# Patient Record
Sex: Female | Born: 1980 | Race: Black or African American | Hispanic: No | Marital: Single | Smoking: Current every day smoker
Health system: Southern US, Community
[De-identification: ages and names within clinical notes are randomized; demographics above are authoritative.]

## PROBLEM LIST (undated history)

## (undated) ENCOUNTER — Inpatient Hospital Stay: Payer: Self-pay

## (undated) DIAGNOSIS — O24419 Gestational diabetes mellitus in pregnancy, unspecified control: Secondary | ICD-10-CM

## (undated) DIAGNOSIS — D649 Anemia, unspecified: Secondary | ICD-10-CM

## (undated) DIAGNOSIS — G5600 Carpal tunnel syndrome, unspecified upper limb: Secondary | ICD-10-CM

## (undated) DIAGNOSIS — M722 Plantar fascial fibromatosis: Secondary | ICD-10-CM

## (undated) DIAGNOSIS — R011 Cardiac murmur, unspecified: Secondary | ICD-10-CM

## (undated) DIAGNOSIS — A749 Chlamydial infection, unspecified: Secondary | ICD-10-CM

## (undated) DIAGNOSIS — T4145XA Adverse effect of unspecified anesthetic, initial encounter: Secondary | ICD-10-CM

## (undated) DIAGNOSIS — K439 Ventral hernia without obstruction or gangrene: Secondary | ICD-10-CM

## (undated) HISTORY — DX: Ventral hernia without obstruction or gangrene: K43.9

## (undated) HISTORY — DX: Carpal tunnel syndrome, unspecified upper limb: G56.00

## (undated) HISTORY — DX: Plantar fascial fibromatosis: M72.2

## (undated) HISTORY — DX: Chlamydial infection, unspecified: A74.9

---
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
## 2004-01-04 ENCOUNTER — Ambulatory Visit (HOSPITAL_COMMUNITY): Admission: RE | Admit: 2004-01-04 | Discharge: 2004-01-04 | Payer: Self-pay | Admitting: Gynecology

## 2004-05-28 ENCOUNTER — Inpatient Hospital Stay (HOSPITAL_COMMUNITY): Admission: AD | Admit: 2004-05-28 | Discharge: 2004-05-31 | Payer: Self-pay | Admitting: Gynecology

## 2004-08-16 ENCOUNTER — Encounter (INDEPENDENT_AMBULATORY_CARE_PROVIDER_SITE_OTHER): Payer: Self-pay | Admitting: *Deleted

## 2004-08-16 ENCOUNTER — Ambulatory Visit (HOSPITAL_COMMUNITY): Admission: RE | Admit: 2004-08-16 | Discharge: 2004-08-16 | Payer: Self-pay | Admitting: Gynecology

## 2005-01-19 ENCOUNTER — Emergency Department: Payer: Self-pay | Admitting: Unknown Physician Specialty

## 2005-05-01 ENCOUNTER — Emergency Department: Payer: Self-pay | Admitting: Emergency Medicine

## 2006-03-31 ENCOUNTER — Emergency Department: Payer: Self-pay | Admitting: Emergency Medicine

## 2006-04-08 ENCOUNTER — Ambulatory Visit: Payer: Self-pay | Admitting: Gynecology

## 2010-06-14 NOTE — H&P (Signed)
Doris West, Doris West NO.:  [id number]   MEDICAL RECORD NO.:  [id number]          PATIENT TYPE:  AMB   LOCATION:  SDC                           FACILITY:  [facility]   PHYSICIAN:  Ginger Carne, MD  DATE OF BIRTH:  Sep 27, 1980   DATE OF ADMISSION:  DATE OF DISCHARGE:                                HISTORY & PHYSICAL   REASON FOR HOSPITALIZATION:  Left dermoid cyst.   HISTORY OF PRESENT ILLNESS:  This is a 30 year old, gravida 2, para 1-0-1-1,  African-American female admitted because of a 4 cm left dermoid cyst.  The  patient recently had a normal vaginal delivery in May of 2006.  During her  antenatal ultrasound at 20 weeks, she was noted to have a 3.5 cm left  ovarian dermoid cyst.  Follow up ultrasound obtained on July 18, 2004  demonstrated a 3.8 cm left dermoid cyst.  The patient has complained of left  lower quadrant pain, but is otherwise asymptomatic.   OBSTETRIC AND GYNECOLOGIC HISTORY:  The patient had a normal vaginal  delivery in May of 2006.  She has had a voluntary termination of pregnancy  in 1999.  Current method of contraception Depo-Provera.   ALLERGIES:  PENICILLIN.   MEDICATION HISTORY:  None.   SURGICAL HISTORY:  Noncontributory.   MEDICAL HISTORY:  Noncontributory.   FAMILY HISTORY:  Negative for first degree relatives with breast, colon,  ovarian, or uterine carcinoma.   SOCIAL HISTORY:  The patient smokes a half pack of cigarettes daily, but  denies illicit drug abuse or alcohol abuse.   REVIEW OF SYSTEMS:  Noncontributory.   PHYSICAL EXAMINATION:  VITAL SIGNS:  Weight 166 pounds, blood pressure  110/72, height 5 feet 7 inches.  HEENT:  Grossly normal.  BREASTS:  Without mass or discharge, thickenings, or tenderness.  CHEST:  Clear to percussion and auscultation.  CARDIOVASCULAR:  Without murmurs or enlargement.  Regular rate and rhythm.  EXTREMITIES/LYMPHATICS/SKIN/NEUROLOGICAL/MUSCULOSKELETAL SYSTEMS:  Within  normal limits.  ABDOMEN:  Soft without gross hepatosplenomegaly.  PELVIC:  External genitalia, vulva, and vagina normal.  Cervix smooth  without erosions or lesions.  Uterus is normal in size.  Left adnexa reveals  a palpable mass measuring approximately 4 cm.  Right adnexa normal.  RECTAL:  Hemoccult negative without masses.   IMPRESSION:  Left ovarian dermoid cyst.   PLAN:  The patient will undergo a left oophorectomy and/or left salpingo-  oophorectomy, and possible laparotomy.  The nature of said procedure  discussed in detail.  Risks including injuries to ureter, bowel, and  bladder, possible conversion to an open procedure, hemorrhage possibly  requiring a blood transfusion, postoperative infection, and other unforeseen  complications were discussed and understood by said patient.       SHB/MEDQ  D:  08/05/2004  T:  08/06/2004  Job:  [id number]

## 2010-06-14 NOTE — Op Note (Signed)
NAMETSUYAKO, JOLLEY NO.:  [id number]   MEDICAL RECORD NO.:  [id number]          PATIENT TYPE:  AMB   LOCATION:  SDC                           FACILITY:  [facility]   PHYSICIAN:  Ginger Carne, MD  DATE OF BIRTH:  03-Jan-1981   DATE OF PROCEDURE:  08/16/2004  DATE OF DISCHARGE:                                 OPERATIVE REPORT   PREOPERATIVE DIAGNOSIS:  Left ovarian dermoid cyst.   POSTOPERATIVE DIAGNOSES:  1.  Left ovarian dermoid cyst.  2.  Stage I endometriosis.   PROCEDURE:  Laparoscopic left oophorectomy.   SURGEON:  Ginger Carne, MD   ASSISTANT:  None.   COMPLICATIONS:  None immediate.   ESTIMATED BLOOD LOSS:  Minimal.   SPECIMEN:  Left ovary.   ANESTHESIA:  General.   OPERATIVE FINDINGS:  Laparoscopic evaluation revealed a 4 cm left ovary  dominated by a dermoid cyst noted preoperatively on successive  ultrasonography.  There was virtually no tissue remaining from said ovary to  be considered normal if the dermoid proper was removed, and therefore the  entire ovary had to be removed.  The left tube and the right tube and ovary  appeared normal.  The patient has stage II endometriosis as well.  There was  stage I endometriosis present with red punctate lesions in the broad  ligaments bilaterally, in the cul-de-sac and uterosacral ligaments.  The  upper abdomen appeared normal, uterine contour normal.  No other findings  observed.   OPERATIVE PROCEDURE:  The patient prepped and in the draped usual fashion  and placed in the the lithotomy position.  Betadine solution used for antiseptic and the patient was catheterized prior  to the procedure.  After adequate general anesthesia, a tenaculum was placed  on the anterior lip of the cervix and a Pelosi uterine manipulator placed in  the endocervical canal.  Afterwards a vertical infraumbilical incision was  made and the Veress needle placed in the abdomen.  Opening and closing  pressures were  10-15 mmHg.  A medial release trocar placed through the same  incision and laparoscope placed in the trocar sleeve.  Two 5 mm ports were  made under direct visualization in the left lower quadrant and left  hypogastric regions.  After identifying the ureter on the left side, the  left mesovarian ligament was bipolar cauterized and cut.  This included the  left utero-ovarian ligament.  Bleeding points were hemostatically checked to  assure there was no active bleeding, and the left tube was left intact.  Afterwards the said  ovary was removed by an Endopouch.  Gas released, trocars removed.  Closure  of the 10 mm fascia site with 0 Vicryl suture and a 4-0 Vicryl for  subcuticular closure.  The instrument and sponge count were correct.  The  patient tolerated procedure well and returned to the postanesthesia recovery  room in excellent condition.       SHB/MEDQ  D:  08/16/2004  T:  08/16/2004  Job:  [id number]

## 2014-01-27 HISTORY — PX: LAPAROSCOPIC OOPHERECTOMY: SHX6507

## 2014-01-27 NOTE — L&D Delivery Note (Signed)
Obstetrical Delivery Note   Date of Delivery:   09/15/2014 Primary OB:   [facility] Gestational Age/EDD: [redacted]w[redacted]d  Antepartum complications: gestational diabetes not well controlled  Delivered By:   Farrel Conners, CNM  Delivery Type:   spontaneous vaginal delivery  Procedure Details:   Just prior to delivery patient developed fever to 101.3 and diagnosed with chorioamnionitis. She received Tylenol 1000 mgm. With mother on her left side, baby rotated during pushing from OP to LOT and delivered spontaneously a viable, but floppy baby with spontaneous respirations over an intact placenta. Baby placed on mother and cord clamped after a minute or two delay and cut. Baby placed in warmer and with tactile stimulation began crying and tone improved. Apgars 7&9. Placenta delivered intact spontaneously and was followed by uterine atony which responded to bimanual, Cytotec 800 mcg PR, and Methergine 0.2 mgm IM.  Anesthesia:    epidural Intrapartum complications: hyperglycemia (on insulin drip), chorioamnionitis, shoulder dystocia GBS:    negative Laceration:    none Episiotomy:    none Placenta:    Via active 3rd stage. To pathology: no Estimated Blood Loss:  * No surgery found *  Baby:    Liveborn female, Apgars 7/9, weight 8#6oz    Avary Eichenberger, CNM

## 2014-02-01 ENCOUNTER — Ambulatory Visit: Payer: Self-pay | Admitting: Family Medicine

## 2014-02-07 ENCOUNTER — Ambulatory Visit: Payer: Self-pay | Admitting: Obstetrics and Gynecology

## 2014-02-07 LAB — HCG, QUANTITATIVE, PREGNANCY: Beta Hcg, Quant.: 66831 m[IU]/mL — ABNORMAL HIGH

## 2014-02-09 ENCOUNTER — Ambulatory Visit: Payer: Self-pay | Admitting: Obstetrics and Gynecology

## 2014-02-09 LAB — HCG, QUANTITATIVE, PREGNANCY: Beta Hcg, Quant.: 81521 m[IU]/mL — ABNORMAL HIGH

## 2014-02-15 ENCOUNTER — Ambulatory Visit: Payer: Self-pay | Admitting: Obstetrics and Gynecology

## 2014-03-13 ENCOUNTER — Emergency Department: Payer: Self-pay | Admitting: Emergency Medicine

## 2014-05-08 ENCOUNTER — Ambulatory Visit
Admit: 2014-05-08 | Disposition: A | Discharge status: Home / Self Care | Payer: Self-pay | Attending: Family Medicine | Admitting: Family Medicine

## 2014-05-15 ENCOUNTER — Emergency Department
Admit: 2014-05-15 | Disposition: A | Discharge status: Home / Self Care | Payer: Self-pay | Admitting: Emergency Medicine

## 2014-07-25 ENCOUNTER — Encounter: Payer: Self-pay | Admitting: *Deleted

## 2014-07-25 ENCOUNTER — Encounter: Payer: Medicaid Other | Attending: Certified Nurse Midwife | Admitting: *Deleted

## 2014-07-25 VITALS — BP 108/62 | Ht 67.5 in | Wt 206.7 lb

## 2014-07-25 DIAGNOSIS — O24419 Gestational diabetes mellitus in pregnancy, unspecified control: Secondary | ICD-10-CM | POA: Insufficient documentation

## 2014-07-25 DIAGNOSIS — O24410 Gestational diabetes mellitus in pregnancy, diet controlled: Secondary | ICD-10-CM

## 2014-07-25 NOTE — Progress Notes (Signed)
**Note De-Doris via Obfuscation** Diabetes Self-Management Education  Visit Type: First/Initial  Appt. Start Time: 1320 Appt. End Time: 1520  07/25/2014  Doris West, Doris West, Doris a 34 y.o. female with a diagnosis of Diabetes: Gestational Diabetes.    ASSESSMENT Blood pressure 108/62, height 5' 7.5" (1.715 m), weight 206 lb 11.2 oz (93.759 kg), last menstrual period 12/25/2013. Body mass index Doris 31.88 kg/(m^2).  Initial Visit Information: Are you currently following a meal plan?: No Are you taking your medications as prescribed?: No (Not taking pre-natal vitamin as prescribed) Are you checking your feet?: No How often do you need to have someone help you when you read instructions, pamphlets, or other written materials from your doctor or pharmacy?: 1 - Never What Doris the last grade level you completed in school?: some college  Psychosocial: Patient Belief/Attitude about Diabetes: Denial ("not sure") Self-care barriers: None Self-management support: Friends, Doctor's office Patient Concerns: Nutrition/Meal planning, Monitoring, Glycemic Control, Problem Solving Special Needs: None Preferred Learning Style: Hands on, Visual Learning Readiness: Contemplating  Complications:  How often do you check your blood sugar?: 0 times/day (not testing) (Pt has a meter but hasn't started testing. BG today was 149 mg/dL at 6:213:10 pm - 2 hrs pp. She had 2 servings of ice cream before class.) Have you had a dilated eye exam in the past 12 months?: No (? Maybe 5 years ago) Have you had a dental exam in the past 12 months?: Yes  Diet Intake: Breakfast: eats at 5-6 am or 10 am Snack (morning): eats multiple servings of desserts/sweets Lunch: eats at 1-2 pm Dinner: eats at 7-8 pm Beverage(s): drinks fruit juices and sugar sweetened beverages  Exercise: Exercise: Light (walking) Light Exercise amount of time (min / week): 50  Individualized Plan for Diabetes Self-Management Training:  Learning  Objective:  Patient will have a greater understanding of diabetes self-management.  Education Topics Reviewed with Patient Today: Definition of diabetes, type 1 and 2, and the diagnosis of gestational diabetes, Factors that contribute to the development of diabetes Role of diet in the treatment of diabetes and the relationship between the three main macronutrients and blood glucose level Helped patient identify appropriate exercises in relation to his/her diabetes, diabetes complications and other health issue. Taught/evaluated SMBG meter., Purpose and frequency of SMBG., Taught/discussed recording of test results and interpretation of SMBG. Relationship between chronic complications and blood glucose control Doris and addressed patients feelings and concerns about diabetes Pregnancy and GDM  Role of pre-pregnancy blood glucose control on the development of the fetus, Reviewed with patient blood glucose goals with pregnancy  PATIENTS GOALS/Plan (Developed by the patient): Improve blood sugars Prevent diabetes complications  Plan:  Read booklet on Gestational Diabetes Follow Gestational Meal Planning Guidelines Complete a 3 Day Food Record and bring to next appointment Check blood sugars 4 x day - before breakfast and 2 hrs after every meal and record  Bring blood sugar log to next appointment Purchase urine ketone strips If blood sugars not in control - check urine ketones every am:  If + increase bedtime snack to 1 protein and 2 carbohydrate servings Walk 20-30 minutes at least 5 x week if permitted by MD Avoid fruit juices and sugar sweetened drinks Limit desserts/sweets  Expected Outcomes:  Other (Demonstrated some interest in learning but unsure if she will make changes.)  Education material provided:  Gestational Meal Planning Guidelines Viewed Gestational Diabetes Video 3 Day Food Record Goals for a Healthy Pregnancy  If problems  or questions, patient to contact team  via:   Sharion Settler, RN, CCM, CDE [phone]  Future DSME appointment:   Wednesday August 02, 2014 at 3:00 pm with dietitian

## 2014-07-25 NOTE — Patient Instructions (Signed)
Read booklet on Gestational Diabetes Follow Gestational Meal Planning Guidelines Complete a 3 Day Food Record and bring to next appointment Check blood sugars 4 x day - before breakfast and 2 hrs after every meal and record  Bring blood sugar log to next appointment Purchase urine ketone strips If blood sugars not in control - check urine ketones every am:  If + increase bedtime snack to 1 protein and 2 carbohydrate servings Walk 20-30 minutes at least 5 x week if permitted by MD Avoid fruit juices and sugar sweetened drinks Limit desserts/sweets

## 2014-08-02 ENCOUNTER — Ambulatory Visit: Payer: Medicaid Other | Admitting: Dietician

## 2014-08-08 ENCOUNTER — Ambulatory Visit: Payer: Medicaid Other | Admitting: Dietician

## 2014-08-08 ENCOUNTER — Encounter: Payer: Medicaid Other | Attending: Certified Nurse Midwife | Admitting: Dietician

## 2014-08-08 VITALS — BP 120/62 | Ht 67.5 in | Wt 206.2 lb

## 2014-08-08 DIAGNOSIS — O24419 Gestational diabetes mellitus in pregnancy, unspecified control: Secondary | ICD-10-CM | POA: Diagnosis present

## 2014-08-08 DIAGNOSIS — O24410 Gestational diabetes mellitus in pregnancy, diet controlled: Secondary | ICD-10-CM

## 2014-08-08 NOTE — Progress Notes (Signed)
   Patient's BG record indicates BGs generally within goal ranges; one reading of 345 after eating 6 chicken nuggets and small order of fries. She did eat sauce with the nuggets, and did not wash hands prior to testing, so suspect a false reading.  She retested BG 2-3 hours later at 78.   Patient's food diary indicates some meals lacking protein, some meals and snacks are only fruit. She is making generally healthy food choices, and has decreased intake of sweet tea.   Provided 1900kcal meal plan, and wrote individualized menus based on patient's food preferences.  Instructed patient on food safety, including avoidance of Listeriosis, and limiting mercury from fish.  Discussed importance of maintaining healthy lifestyle habits to reduce risk of Type 2 DM as well as Gestational DM with any future pregnancies.  Advised patient to use any remaining testing supplies to test some BGs after delivery, and to have BG tested ideally annually, as well as prior to attempting future pregnancies.

## 2014-08-25 ENCOUNTER — Observation Stay
Admission: EM | Admit: 2014-08-25 | Discharge: 2014-08-26 | Disposition: A | Discharge status: Home / Self Care | Payer: Medicaid Other | Attending: Obstetrics and Gynecology | Admitting: Obstetrics and Gynecology

## 2014-08-25 DIAGNOSIS — O26893 Other specified pregnancy related conditions, third trimester: Secondary | ICD-10-CM | POA: Diagnosis present

## 2014-08-25 DIAGNOSIS — Z3490 Encounter for supervision of normal pregnancy, unspecified, unspecified trimester: Secondary | ICD-10-CM

## 2014-08-25 DIAGNOSIS — Z3A35 35 weeks gestation of pregnancy: Secondary | ICD-10-CM | POA: Insufficient documentation

## 2014-08-25 HISTORY — DX: Anemia, unspecified: D64.9

## 2014-08-25 HISTORY — DX: Gestational diabetes mellitus in pregnancy, unspecified control: O24.419

## 2014-08-25 NOTE — OB Triage Note (Signed)
To L&D with c/o contractions or abdominal pain since 2130. Denies VB or ROM. No N, V or diarrhea

## 2014-08-26 NOTE — Progress Notes (Signed)
Dr. Bonney Aid at bedside, reviewed strip discussed d/c info.  Pt to keep appt on Monday with [facility]

## 2014-08-26 NOTE — Progress Notes (Signed)
Pt given d/c instructions and verbalized understanding.  Pt was then d/c home in stable condition with her son.

## 2014-08-26 NOTE — H&P (Signed)
Obstetric H&P   Chief Complaint: Contractions  Prenatal Care Provider: [facility]  History of Present Illness: 34 y.o. G1P0 76w6dby 10/01/2014, by Last Menstrual Period presenting with multiple complains.  She reports contractions over the last 3 days, no increasing in intensity but feeling different than her prior contractions.  Not painful but uncomfortable, mainly tightening.  +FM, no LOF, no VB  Also reports sharp shooting pain into groin and hips, worse with ambulation.  She also reports pain in her left wrist over the past 3 month with tenderness in the snuffbox.  PNC noteable for transfer of care from [facility] 28 weeks because of GDM with poor compliance with BG monitoring.  More recent growth scan at 79%ile at 359w2d   B pos / ABSC neg / RI / VZI / RPR NR / HIV neg / HBsAg neg  Review of Systems: 10 point review of systems negative unless otherwise noted in HPI  Past Medical History: Past Medical History  Diagnosis Date  . Anemia   . Gestational diabetes     Past Surgical History: History reviewed. No pertinent past surgical history.  Family History: Family History  Problem Relation Age of Onset  . Diabetes Paternal Grandmother     Social History: History   Social History  . Marital Status: Single    Spouse Name: N/A  . Number of Children: N/A  . Years of Education: N/A   Occupational History  . Not on file.   Social History Main Topics  . Smoking status: Former Smoker -- 0.50 packs/day for 15 years    Types: Cigarettes    Quit date: 01/26/2014  . Smokeless tobacco: Never Used  . Alcohol Use: No  . Drug Use: No  . Sexual Activity: Yes   Other Topics Concern  . Not on file   Social History Narrative    Medications: Prior to Admission medications   Medication Sig Start Date End Date Taking? Authorizing Provider  ACCU-CHEK AVIVA PLUS test strip USE AS DIRECTED CHECK BLOOD SUGAR LEVELS BEFORE EATING IN THE AM AND THEN 2 HOURS AFTER MEALS 07/19/14    Historical Provider, MD  Blood Glucose Monitoring Suppl (ACCU-CHEK AVIVA PLUS) W/DEVICE KIT See admin instructions. 07/19/14   Historical Provider, MD  DOCOSAHEXAENOIC ACID PO Take 1 tablet by mouth daily.    Historical Provider, MD    Allergies: Allergies  Allergen Reactions  . Penicillin G Hives    Physical Exam: Vitals: Blood pressure 129/75, pulse 91, temperature 98.1 F (36.7 C), temperature source Oral, resp. rate 18, weight 95.255 kg (210 lb), last menstrual period 12/25/2013.   FHT: 150, moderate, +accels, no decels Toco:q8m69m General: NAD HEENT: normocephalic, anicteric Pulmonary: no increased work of breathing Abdomen: Gravid,  Non-tender Genitourinary: cervix closed on admission Extremities: no edema  Labs: No results found for this or any previous visit (from the past 24 hour(s)).  Assessment: 33 36o. G1P0 34w77w6d9/04/2014, presenting with discomforts of pregnancy  Plan: 1) R/O labor - cervix closed, irregular contractions.  Routine labor precautions.  Discussed supportive measures for round ligament pain.  Has history of carpel tunnel but her wrist pain is more consistent with de quervains   2) Fetus - cat I tracing, reactive NST  3) Disposition - dischrge home follow up scheduled 08/28/14

## 2014-09-13 ENCOUNTER — Inpatient Hospital Stay
Admission: EM | Admit: 2014-09-13 | Discharge: 2014-09-17 | DRG: 775 | Disposition: A | Discharge status: Home / Self Care | Payer: Medicaid Other | Attending: Obstetrics & Gynecology | Admitting: Obstetrics & Gynecology

## 2014-09-13 DIAGNOSIS — Z9111 Patient's noncompliance with dietary regimen: Secondary | ICD-10-CM | POA: Diagnosis present

## 2014-09-13 DIAGNOSIS — Z91199 Patient's noncompliance with other medical treatment and regimen due to unspecified reason: Secondary | ICD-10-CM

## 2014-09-13 DIAGNOSIS — O411230 Chorioamnionitis, third trimester, not applicable or unspecified: Secondary | ICD-10-CM | POA: Diagnosis present

## 2014-09-13 DIAGNOSIS — O24424 Gestational diabetes mellitus in childbirth, insulin controlled: Secondary | ICD-10-CM | POA: Diagnosis present

## 2014-09-13 DIAGNOSIS — Z794 Long term (current) use of insulin: Secondary | ICD-10-CM | POA: Diagnosis not present

## 2014-09-13 DIAGNOSIS — O9962 Diseases of the digestive system complicating childbirth: Secondary | ICD-10-CM | POA: Diagnosis present

## 2014-09-13 DIAGNOSIS — O26893 Other specified pregnancy related conditions, third trimester: Secondary | ICD-10-CM | POA: Diagnosis present

## 2014-09-13 DIAGNOSIS — O24419 Gestational diabetes mellitus in pregnancy, unspecified control: Secondary | ICD-10-CM

## 2014-09-13 DIAGNOSIS — O9902 Anemia complicating childbirth: Secondary | ICD-10-CM | POA: Diagnosis present

## 2014-09-13 DIAGNOSIS — Z87891 Personal history of nicotine dependence: Secondary | ICD-10-CM | POA: Diagnosis not present

## 2014-09-13 DIAGNOSIS — Z3A37 37 weeks gestation of pregnancy: Secondary | ICD-10-CM | POA: Diagnosis present

## 2014-09-13 DIAGNOSIS — K429 Umbilical hernia without obstruction or gangrene: Secondary | ICD-10-CM | POA: Diagnosis present

## 2014-09-13 DIAGNOSIS — Z9119 Patient's noncompliance with other medical treatment and regimen: Secondary | ICD-10-CM

## 2014-09-13 HISTORY — DX: Gestational diabetes mellitus in pregnancy, unspecified control: O24.419

## 2014-09-13 LAB — CBC
HCT: 31.7 % — ABNORMAL LOW (ref 35.0–47.0)
Hemoglobin: 10.2 g/dL — ABNORMAL LOW (ref 12.0–16.0)
MCH: 26.4 pg (ref 26.0–34.0)
MCHC: 32.1 g/dL (ref 32.0–36.0)
MCV: 82.2 fL (ref 80.0–100.0)
PLATELETS: 226 10*3/uL (ref 150–440)
RBC: 3.86 MIL/uL (ref 3.80–5.20)
RDW: 16 % — AB (ref 11.5–14.5)
WBC: 6.8 10*3/uL (ref 3.6–11.0)

## 2014-09-13 LAB — GLUCOSE, CAPILLARY: Glucose-Capillary: 135 mg/dL — ABNORMAL HIGH (ref 65–99)

## 2014-09-13 LAB — COMPREHENSIVE METABOLIC PANEL
ALT: 14 U/L (ref 14–54)
ANION GAP: 11 (ref 5–15)
AST: 37 U/L (ref 15–41)
Albumin: 3.2 g/dL — ABNORMAL LOW (ref 3.5–5.0)
Alkaline Phosphatase: 152 U/L — ABNORMAL HIGH (ref 38–126)
BILIRUBIN TOTAL: 0.4 mg/dL (ref 0.3–1.2)
BUN: 9 mg/dL (ref 6–20)
CHLORIDE: 103 mmol/L (ref 101–111)
CO2: 20 mmol/L — ABNORMAL LOW (ref 22–32)
Calcium: 9.9 mg/dL (ref 8.9–10.3)
Creatinine, Ser: 0.65 mg/dL (ref 0.44–1.00)
GFR calc non Af Amer: >60 mL/min (ref >60)
Glucose, Bld: 143 mg/dL — ABNORMAL HIGH (ref 65–99)
POTASSIUM: 3.4 mmol/L — AB (ref 3.5–5.1)
Sodium: 134 mmol/L — ABNORMAL LOW (ref 135–145)
TOTAL PROTEIN: 7 g/dL (ref 6.5–8.1)

## 2014-09-13 MED ORDER — AMMONIA AROMATIC IN INHA: 0.3000 mL | Freq: Once | RESPIRATORY_TRACT | Status: DC | PRN

## 2014-09-13 MED ORDER — OXYTOCIN BOLUS FROM INFUSION
500.0000 mL | INTRAVENOUS | Status: DC
  Administered 2014-09-15: 500 mL via INTRAVENOUS

## 2014-09-13 MED ORDER — ONDANSETRON HCL 4 MG/2ML IJ SOLN: 4.0000 mg | Freq: Four times a day (QID) | INTRAMUSCULAR | Status: DC | PRN

## 2014-09-13 MED ORDER — OXYTOCIN 40 UNITS IN LACTATED RINGERS INFUSION - SIMPLE MED
62.5000 mL/h | INTRAVENOUS | Status: DC
  Filled 2014-09-13: qty 1000

## 2014-09-13 MED ORDER — LIDOCAINE HCL (PF) 1 % IJ SOLN
30.0000 mL | INTRAMUSCULAR | Status: DC | PRN
  Filled 2014-09-13: qty 30

## 2014-09-13 MED ORDER — FENTANYL CITRATE (PF) 100 MCG/2ML IJ SOLN
50.0000 ug | INTRAMUSCULAR | Status: DC | PRN
Start: 2014-09-13 — End: 2014-09-15
  Administered 2014-09-14 – 2014-09-15 (×2): 100 ug via INTRAVENOUS
  Filled 2014-09-13 (×2): qty 2

## 2014-09-13 MED ORDER — LACTATED RINGERS IV SOLN
INTRAVENOUS | Status: DC
  Administered 2014-09-14 – 2014-09-15 (×2): via INTRAVENOUS

## 2014-09-13 MED ORDER — MISOPROSTOL 200 MCG PO TABS
800.0000 ug | ORAL_TABLET | Freq: Once | ORAL | Status: AC | PRN
  Administered 2014-09-15: 800 ug via RECTAL
  Filled 2014-09-13: qty 4

## 2014-09-13 MED ORDER — LACTATED RINGERS IV SOLN: 500.0000 mL | INTRAVENOUS | Status: DC | PRN

## 2014-09-13 MED ORDER — FAMOTIDINE 20 MG PO TABS
20.0000 mg | ORAL_TABLET | Freq: Two times a day (BID) | ORAL | Status: DC
  Administered 2014-09-13 – 2014-09-16 (×3): 20 mg via ORAL
  Filled 2014-09-13 (×5): qty 1

## 2014-09-13 MED ORDER — TERBUTALINE SULFATE 1 MG/ML IJ SOLN: 0.2500 mg | Freq: Once | INTRAMUSCULAR | Status: DC | PRN

## 2014-09-13 MED ORDER — DINOPROSTONE 10 MG VA INST
10.0000 mg | VAGINAL_INSERT | Freq: Once | VAGINAL | Status: AC
  Administered 2014-09-13: 10 mg via VAGINAL
  Filled 2014-09-13: qty 1

## 2014-09-13 NOTE — ED Notes (Signed)
Patient here for induction. To OBS 2

## 2014-09-13 NOTE — H&P (Signed)
Date of Initial H&P: **September 12, 2014   History reviewed, patient examined, no changes except as listed below:  34 year old AAF G5 P1031 with EDC=10/01/2014 presents at 37.3 weeks for IOL for poorly controlled GDM. Antepartum testing has been reassuring Current medications: Tums B POS/ VI/ RI/ GBS negative  Exam: in NAD, anxious BP 123/76 mmHg  Pulse 101  Temp(Src) 98.4 F (36.9 C) (Oral)  Resp 16  Ht  (1.753 m)  Wt 210 lb (95.255 kg)  BMI 31.00 kg/m2  LMP 12/25/2013  FHR 150s with accelerations to 160s to 170s, mod variability Toco: mild q5-6 min CBG 135 at 2230 (2hr pp) and 110 at 0030 Cervix: 1/30%/-2 to -3/ vtx on [place] EFW 8/8 was 3000gms (6-10) 59.2% with normal AFI   A: IUP at 37.3 weeks with poorly controlled GDM Cat 1 tracing GBS negative  P: Plan for Cervidil tonight CBG q2 hours Monitor for contractions and fetal well being  Ilisha Blust, CNM

## 2014-09-14 LAB — ABO/RH: ABO/RH(D): B POS

## 2014-09-14 LAB — GLUCOSE, CAPILLARY
GLUCOSE-CAPILLARY: 102 mg/dL — AB (ref 65–99)
GLUCOSE-CAPILLARY: 110 mg/dL — AB (ref 65–99)
GLUCOSE-CAPILLARY: 134 mg/dL — AB (ref 65–99)
GLUCOSE-CAPILLARY: 150 mg/dL — AB (ref 65–99)
GLUCOSE-CAPILLARY: 80 mg/dL (ref 65–99)
GLUCOSE-CAPILLARY: 95 mg/dL (ref 65–99)
GLUCOSE-CAPILLARY: 98 mg/dL (ref 65–99)

## 2014-09-14 LAB — TYPE AND SCREEN
ABO/RH(D): B POS
ANTIBODY SCREEN: NEGATIVE

## 2014-09-14 MED ORDER — DEXTROSE-NACL 5-0.45 % IV SOLN: INTRAVENOUS | Status: DC

## 2014-09-14 MED ORDER — INSULIN REGULAR BOLUS VIA INFUSION
1.0000 [IU] | INTRAVENOUS | Status: DC | PRN
  Filled 2014-09-14 (×3): qty 1

## 2014-09-14 MED ORDER — OXYTOCIN 40 UNITS IN LACTATED RINGERS INFUSION - SIMPLE MED
1.0000 m[IU]/min | INTRAVENOUS | Status: DC
  Administered 2014-09-14: 6 m[IU]/min via INTRAVENOUS
  Administered 2014-09-14: 10 m[IU]/min via INTRAVENOUS
  Administered 2014-09-14: 1 m[IU]/min via INTRAVENOUS

## 2014-09-14 MED ORDER — DEXTROSE 5 % IV SOLN
INTRAVENOUS | Status: DC
  Administered 2014-09-14: 20:00:00 via INTRAVENOUS

## 2014-09-14 MED ORDER — DIPHENHYDRAMINE HCL 25 MG PO CAPS
25.0000 mg | ORAL_CAPSULE | Freq: Once | ORAL | Status: DC
  Filled 2014-09-14: qty 1

## 2014-09-14 MED ORDER — DEXTROSE 50 % IV SOLN
25.0000 mL | INTRAVENOUS | Status: DC | PRN
  Filled 2014-09-14: qty 50

## 2014-09-14 MED ORDER — SODIUM CHLORIDE 0.9 % IV SOLN
INTRAVENOUS | Status: DC
  Administered 2014-09-14: 23:00:00 via INTRAVENOUS
  Filled 2014-09-14 (×2): qty 100

## 2014-09-14 MED ORDER — BUTORPHANOL TARTRATE 1 MG/ML IJ SOLN
INTRAMUSCULAR | Status: AC
  Filled 2014-09-14: qty 1

## 2014-09-14 MED ORDER — SODIUM CHLORIDE 0.9 % IV SOLN
INTRAVENOUS | Status: DC
  Filled 2014-09-14: qty 2.5

## 2014-09-14 MED ORDER — SODIUM CHLORIDE 0.9 % IV SOLN: INTRAVENOUS | Status: DC

## 2014-09-14 MED ORDER — INSULIN REGULAR BOLUS VIA INFUSION
0.0000 [IU] | Freq: Three times a day (TID) | INTRAVENOUS | Status: DC
  Filled 2014-09-14: qty 10

## 2014-09-14 NOTE — Progress Notes (Signed)
Intrapartum progress note:  S: patient feeling contractions, has a complaint about most things.  Just ate at diabetic friendly meal.  O:  BP 123/76 mmHg  Pulse 101  Temp(Src) 98.3 F (36.8 C) (Oral)  Resp 18   BMI 31.00 kg/m2   FHT: 145 mod + accels no decels Toco: irregular SVE: difficult to assess due to patient discomfort and intolerance of exam.  Cervix to patient's right, posterior, thick, likely 1cm.  Fetal station -3.   SSE:  Foley bulb placed  BG: 12:30= 80, 08:30 = 95  At 16:30 was 150, not 2h pp.  At 1900 = 136  A/P: 34yo G2P1001 @ 37.4 with poorly controlled/non-compliant GDMA at term 1. IUP:  Category 1 tracing, continue with IOL 2. IOL: foley bulb in place, pitocin to start at regular dosing 3. GDM: post prandial glucose levels elevated:  Initiate insulin protocol.  Continue DM diet, now clear since will be laboring. 4. Continuous EFM/TOCO  ----- Ranae Plumber, MD Attending Obstetrician and Gynecologist [facility]

## 2014-09-15 ENCOUNTER — Inpatient Hospital Stay: Payer: Medicaid Other | Admitting: Anesthesiology

## 2014-09-15 ENCOUNTER — Encounter: Payer: Self-pay | Admitting: *Deleted

## 2014-09-15 LAB — GLUCOSE, CAPILLARY
GLUCOSE-CAPILLARY: 82 mg/dL (ref 65–99)
GLUCOSE-CAPILLARY: 84 mg/dL (ref 65–99)
GLUCOSE-CAPILLARY: 86 mg/dL (ref 65–99)
GLUCOSE-CAPILLARY: 86 mg/dL (ref 65–99)
GLUCOSE-CAPILLARY: 89 mg/dL (ref 65–99)
GLUCOSE-CAPILLARY: 91 mg/dL (ref 65–99)
Glucose-Capillary: 86 mg/dL (ref 65–99)
Glucose-Capillary: 94 mg/dL (ref 65–99)
Glucose-Capillary: 100 mg/dL — ABNORMAL HIGH (ref 65–99)

## 2014-09-15 LAB — RPR: RPR: NONREACTIVE

## 2014-09-15 MED ORDER — SODIUM CHLORIDE 0.9 % IJ SOLN
3.0000 mL | INTRAMUSCULAR | Status: DC | PRN
Start: 2014-09-15 — End: 2014-09-17

## 2014-09-15 MED ORDER — ACETAMINOPHEN 500 MG PO TABS
ORAL_TABLET | ORAL | Status: AC
  Administered 2014-09-15: 1000 mg via ORAL
  Filled 2014-09-15: qty 2

## 2014-09-15 MED ORDER — DIBUCAINE 1 % RE OINT
1.0000 "application " | TOPICAL_OINTMENT | RECTAL | Status: DC | PRN
  Administered 2014-09-16: 1 via RECTAL
  Filled 2014-09-15: qty 28

## 2014-09-15 MED ORDER — FENTANYL 2.5 MCG/ML W/ROPIVACAINE 0.2% IN NS 100 ML EPIDURAL INFUSION (ARMC-ANES)
10.0000 mL/h | EPIDURAL | Status: DC
  Administered 2014-09-15: 8 mL/h via EPIDURAL
  Filled 2014-09-15: qty 100

## 2014-09-15 MED ORDER — IBUPROFEN 600 MG PO TABS
600.0000 mg | ORAL_TABLET | Freq: Four times a day (QID) | ORAL | Status: DC
  Administered 2014-09-15 – 2014-09-17 (×7): 600 mg via ORAL
  Filled 2014-09-15 (×6): qty 1

## 2014-09-15 MED ORDER — DIPHENHYDRAMINE HCL 25 MG PO CAPS: 25.0000 mg | ORAL_CAPSULE | Freq: Four times a day (QID) | ORAL | Status: DC | PRN

## 2014-09-15 MED ORDER — ONDANSETRON HCL 4 MG PO TABS: 4.0000 mg | ORAL_TABLET | ORAL | Status: DC | PRN

## 2014-09-15 MED ORDER — ACETAMINOPHEN 325 MG PO TABS: 650.0000 mg | ORAL_TABLET | ORAL | Status: DC | PRN

## 2014-09-15 MED ORDER — ONDANSETRON HCL 4 MG/2ML IJ SOLN: 4.0000 mg | Freq: Three times a day (TID) | INTRAMUSCULAR | Status: DC | PRN

## 2014-09-15 MED ORDER — NALOXONE HCL 1 MG/ML IJ SOLN
1.0000 ug/kg/h | INTRAMUSCULAR | Status: DC | PRN
  Filled 2014-09-15: qty 2

## 2014-09-15 MED ORDER — SIMETHICONE 80 MG PO CHEW: 80.0000 mg | CHEWABLE_TABLET | ORAL | Status: DC | PRN

## 2014-09-15 MED ORDER — NALBUPHINE HCL 10 MG/ML IJ SOLN
5.0000 mg | INTRAMUSCULAR | Status: DC | PRN
  Filled 2014-09-15: qty 0.5

## 2014-09-15 MED ORDER — LIDOCAINE HCL (PF) 1 % IJ SOLN
INTRAMUSCULAR | Status: AC
  Filled 2014-09-15: qty 30

## 2014-09-15 MED ORDER — DIPHENHYDRAMINE HCL 25 MG PO CAPS: 25.0000 mg | ORAL_CAPSULE | ORAL | Status: DC | PRN

## 2014-09-15 MED ORDER — AMMONIA AROMATIC IN INHA
RESPIRATORY_TRACT | Status: AC
  Filled 2014-09-15: qty 10

## 2014-09-15 MED ORDER — DOCUSATE SODIUM 100 MG PO CAPS
100.0000 mg | ORAL_CAPSULE | Freq: Two times a day (BID) | ORAL | Status: DC
  Administered 2014-09-15 – 2014-09-17 (×4): 100 mg via ORAL
  Filled 2014-09-15 (×4): qty 1

## 2014-09-15 MED ORDER — WITCH HAZEL-GLYCERIN EX PADS
1.0000 "application " | MEDICATED_PAD | CUTANEOUS | Status: DC | PRN
  Administered 2014-09-16: 1 via TOPICAL
  Filled 2014-09-15: qty 100

## 2014-09-15 MED ORDER — BUPIVACAINE HCL (PF) 0.25 % IJ SOLN
INTRAMUSCULAR | Status: DC | PRN
  Administered 2014-09-15: 5 mL

## 2014-09-15 MED ORDER — GENTAMICIN SULFATE 40 MG/ML IJ SOLN
120.0000 mg | Freq: Three times a day (TID) | INTRAVENOUS | Status: AC
  Administered 2014-09-15 – 2014-09-16 (×2): 120 mg via INTRAVENOUS
  Filled 2014-09-15 (×2): qty 3

## 2014-09-15 MED ORDER — MISOPROSTOL 200 MCG PO TABS
ORAL_TABLET | ORAL | Status: AC
  Administered 2014-09-15: 800 ug via RECTAL
  Filled 2014-09-15: qty 4

## 2014-09-15 MED ORDER — OXYCODONE-ACETAMINOPHEN 5-325 MG PO TABS
ORAL_TABLET | ORAL | Status: AC
  Filled 2014-09-15: qty 1

## 2014-09-15 MED ORDER — DIPHENHYDRAMINE HCL 50 MG/ML IJ SOLN: 12.5000 mg | INTRAMUSCULAR | Status: DC | PRN

## 2014-09-15 MED ORDER — GENTAMICIN SULFATE 40 MG/ML IJ SOLN
200.0000 mg | Freq: Once | INTRAVENOUS | Status: AC
  Administered 2014-09-15: 200 mg via INTRAVENOUS
  Filled 2014-09-15: qty 5

## 2014-09-15 MED ORDER — MEPERIDINE HCL 25 MG/ML IJ SOLN: 6.2500 mg | INTRAMUSCULAR | Status: DC | PRN

## 2014-09-15 MED ORDER — PRENATAL MULTIVITAMIN CH
1.0000 | ORAL_TABLET | Freq: Every day | ORAL | Status: DC
  Administered 2014-09-16: 1 via ORAL
  Filled 2014-09-15: qty 1

## 2014-09-15 MED ORDER — NALOXONE HCL 0.4 MG/ML IJ SOLN: 0.4000 mg | INTRAMUSCULAR | Status: DC | PRN

## 2014-09-15 MED ORDER — SODIUM CHLORIDE 0.9 % IJ SOLN
INTRAMUSCULAR | Status: AC
  Filled 2014-09-15: qty 3

## 2014-09-15 MED ORDER — NALBUPHINE HCL 10 MG/ML IJ SOLN
5.0000 mg | Freq: Once | INTRAMUSCULAR | Status: DC | PRN
  Filled 2014-09-15: qty 0.5

## 2014-09-15 MED ORDER — TETANUS-DIPHTH-ACELL PERTUSSIS 5-2.5-18.5 LF-MCG/0.5 IM SUSP: 0.5000 mL | Freq: Once | INTRAMUSCULAR | Status: DC

## 2014-09-15 MED ORDER — FENTANYL 2.5 MCG/ML W/ROPIVACAINE 0.2% IN NS 100 ML EPIDURAL INFUSION (ARMC-ANES)
EPIDURAL | Status: AC
  Administered 2014-09-15: 8 mL/h via EPIDURAL
  Administered 2014-09-15: 10 mL/h via EPIDURAL
  Filled 2014-09-15: qty 100

## 2014-09-15 MED ORDER — OXYTOCIN 40 UNITS IN LACTATED RINGERS INFUSION - SIMPLE MED
100.0000 mL/h | INTRAVENOUS | Status: DC
  Filled 2014-09-15: qty 1000

## 2014-09-15 MED ORDER — GENTAMICIN SULFATE 40 MG/ML IJ SOLN
120.0000 mg | Freq: Three times a day (TID) | INTRAVENOUS | Status: DC
  Filled 2014-09-15 (×3): qty 3

## 2014-09-15 MED ORDER — SCOPOLAMINE 1 MG/3DAYS TD PT72: 1.0000 | MEDICATED_PATCH | Freq: Once | TRANSDERMAL | Status: DC

## 2014-09-15 MED ORDER — LIDOCAINE-EPINEPHRINE (PF) 1.5 %-1:200000 IJ SOLN
INTRAMUSCULAR | Status: DC | PRN
  Administered 2014-09-15: 3 mL via EPIDURAL

## 2014-09-15 MED ORDER — MAGNESIUM HYDROXIDE 400 MG/5ML PO SUSP: 30.0000 mL | ORAL | Status: DC | PRN

## 2014-09-15 MED ORDER — GENTAMICIN SULFATE 40 MG/ML IJ SOLN: 1.5000 mg/kg | Freq: Three times a day (TID) | INTRAVENOUS | Status: DC

## 2014-09-15 MED ORDER — IBUPROFEN 600 MG PO TABS
ORAL_TABLET | ORAL | Status: AC
  Administered 2014-09-15: 600 mg via ORAL
  Filled 2014-09-15: qty 1

## 2014-09-15 MED ORDER — OXYCODONE-ACETAMINOPHEN 5-325 MG PO TABS
1.0000 | ORAL_TABLET | Freq: Four times a day (QID) | ORAL | Status: DC | PRN
  Administered 2014-09-15: 1 via ORAL

## 2014-09-15 MED ORDER — ONDANSETRON HCL 4 MG/2ML IJ SOLN: 4.0000 mg | INTRAMUSCULAR | Status: DC | PRN

## 2014-09-15 MED ORDER — SODIUM CHLORIDE 0.9 % IV SOLN
INTRAVENOUS | Status: AC
  Filled 2014-09-15: qty 2000

## 2014-09-15 MED ORDER — ACETAMINOPHEN 500 MG PO TABS
1000.0000 mg | ORAL_TABLET | Freq: Four times a day (QID) | ORAL | Status: DC | PRN
  Administered 2014-09-15: 1000 mg via ORAL

## 2014-09-15 MED ORDER — METHYLERGONOVINE MALEATE 0.2 MG/ML IJ SOLN
INTRAMUSCULAR | Status: AC
  Administered 2014-09-15: 0.2 mg via INTRAMUSCULAR
  Filled 2014-09-15: qty 1

## 2014-09-15 MED ORDER — BENZOCAINE-MENTHOL 20-0.5 % EX AERO
1.0000 "application " | INHALATION_SPRAY | CUTANEOUS | Status: DC | PRN
  Filled 2014-09-15 (×2): qty 56

## 2014-09-15 MED ORDER — LIDOCAINE HCL (PF) 1 % IJ SOLN
INTRAMUSCULAR | Status: DC | PRN
  Administered 2014-09-15: 1 mL via INTRADERMAL

## 2014-09-15 MED ORDER — LANOLIN HYDROUS EX OINT: TOPICAL_OINTMENT | CUTANEOUS | Status: DC | PRN

## 2014-09-15 MED ORDER — SODIUM CHLORIDE 0.9 % IV SOLN: 2.0000 g | Freq: Four times a day (QID) | INTRAVENOUS | Status: DC

## 2014-09-15 NOTE — Anesthesia Preprocedure Evaluation (Signed)
Anesthesia Evaluation  Patient identified by MRN, date of birth, ID band Patient awake    Reviewed: Allergy & Precautions, H&P , NPO status , Patient's Chart, lab work & pertinent test results  History of Anesthesia Complications Negative for: history of anesthetic complications  Airway Mallampati: III  TM Distance: >3 FB Neck ROM: full    Dental no notable dental hx. (+) Teeth Intact   Pulmonary former smoker,  breath sounds clear to auscultation  Pulmonary exam normal       Cardiovascular Exercise Tolerance: Good negative cardio ROS Normal cardiovascular examRhythm:regular Rate:Normal     Neuro/Psych negative neurological ROS  negative psych ROS   GI/Hepatic negative GI ROS, Neg liver ROS,   Endo/Other  diabetes  Renal/GU negative Renal ROS  negative genitourinary   Musculoskeletal   Abdominal   Peds  Hematology negative hematology ROS (+)   Anesthesia Other Findings Past Medical History:   Anemia                                                       Gestational diabetes                                         Reproductive/Obstetrics (+) Pregnancy                             Anesthesia Physical Anesthesia Plan  ASA: III  Anesthesia Plan: Epidural   Post-op Pain Management:    Induction:   Airway Management Planned:   Additional Equipment:   Intra-op Plan:   Post-operative Plan:   Informed Consent: I have reviewed the patients History and Physical, chart, labs and discussed the procedure including the risks, benefits and alternatives for the proposed anesthesia with the patient or authorized representative who has indicated his/her understanding and acceptance.   Dental Advisory Given  Plan Discussed with: Anesthesiologist, CRNA and Surgeon  Anesthesia Plan Comments:         Anesthesia Quick Evaluation

## 2014-09-15 NOTE — Progress Notes (Signed)
OB Note chorio via temp. Amp/gent/apap. Pt pushing better  Cornelia Copa MD [facility]  Pager: [phone]

## 2014-09-15 NOTE — Progress Notes (Signed)
L&D Note  09/15/2014 - 11:08 AM  34 y.o. G5P1031 [redacted]w[redacted]d (19wk u/s). Pregnancy complicated by poor patient compliance and refusal to check blood sugars, GDM, h/o STIs, BMI 32, umbilical hernia  Ms. Doris West is admitted for IOL due to poorly compliant GDM   Subjective:  Feels UCs; not painful  Objective:    Current Vital Signs 24h Vital Sign Ranges  T 98.7 F (37.1 C) Temp  Avg: 98.6 F (37 C)  Min: 98.3 F (36.8 C)  Max: 98.7 F (37.1 C)  BP (!) 92/47 mmHg BP  Min: 63/35  Max: 120/66  HR 93 Pulse  Avg: 97.7  Min: 89  Max: 106  RR 18 Resp  Avg: 19  Min: 18  Max: 20  SaO2    98/RA No Data Recorded       24 Hour I/O Current Shift I/O  Time Ins Outs 08/18 0701 - 08/19 0700 In: 1691.4 [I.V.:1659.8] Out: -  08/19 0701 - 08/19 1900 In: -  Out: 1250 [Urine:1250]   FHR: 150 baseline, no accels, early decels with pushing, mod variability Toco: q2-47m Gen: NAD SVE: complete/+3 with pushing/ feels DOA  Labs:   Recent Labs Lab 09/13/14 2055  WBC 6.8  HGB 10.2*  HCT 31.7*  PLT 226    Recent Labs Lab 09/13/14 2055  NA 134*  K 3.4*  CL 103  CO2 20*  BUN 9  CREATININE 0.65  CALCIUM 9.9  PROT 7.0  BILITOT 0.4  ALKPHOS 152*  ALT 14  AST 37  GLUCOSE 143*   Results for Doris West, Doris West (MRN [id number]) as of 09/15/2014 08:38  Ref. Range 09/15/2014 01:58 09/15/2014 03:21 09/15/2014 04:45 09/15/2014 06:33 09/15/2014 07:56  Glucose-Capillary Latest Ref Range: 65-99 mg/dL 89 84 91 94 130 (H)    Medications Current Facility-Administered Medications  Medication Dose Route Frequency Provider Last Rate Last Dose  . 0.9 %  sodium chloride infusion   Intravenous Continuous Chelsea C Ward, MD      . ammonia inhalant 0.3 mL  0.3 mL Inhalation Once PRN Farrel Conners, CNM      . ammonia inhalant           . dextrose 5 % solution   Intravenous Continuous Elenora Fender Ward, MD 100 mL/hr at 09/14/14 2327    . dextrose 5 %-0.45 % sodium chloride infusion   Intravenous Continuous  Chelsea C Ward, MD      . dextrose 50 % solution 25 mL  25 mL Intravenous PRN Chelsea C Ward, MD      . diphenhydrAMINE (BENADRYL) capsule 25 mg  25 mg Oral Once Elenora Fender Ward, MD   25 mg at 09/14/14 2329  . diphenhydrAMINE (BENADRYL) injection 12.5 mg  12.5 mg Intravenous Q4H PRN Rosaria Ferries, MD       Or  . diphenhydrAMINE (BENADRYL) capsule 25 mg  25 mg Oral Q4H PRN Rosaria Ferries, MD      . famotidine (PEPCID) tablet 20 mg  20 mg Oral BID Farrel Conners, CNM   20 mg at 09/13/14 2336  . fentaNYL (SUBLIMAZE) injection 50-100 mcg  50-100 mcg Intravenous Q1H PRN Farrel Conners, CNM   100 mcg at 09/15/14 0042  . fentaNYL 2.5 mcg/ml w/ropivacaine 0.2% (preservative free) in normal saline 100 mL EPIDURAL Infusion in 150 ml Intravia Bag  10 mL/hr Epidural Continuous Cleda Mccreedy Piscitello, MD      . Insulin Regular infusion 100 units in 100 ml NS  Intravenous Continuous Elenora Fender Ward, MD 0.5 mL/hr at 09/14/14 2327    . lactated ringers infusion 500-1,000 mL  500-1,000 mL Intravenous PRN Farrel Conners, CNM      . lactated ringers infusion   Intravenous Continuous Farrel Conners, CNM 20 mL/hr at 09/15/14 0401    . lidocaine (PF) (XYLOCAINE) 1 % injection 30 mL  30 mL Subcutaneous PRN Farrel Conners, CNM      . lidocaine (PF) (XYLOCAINE) 1 % injection           . meperidine (DEMEROL) injection 6.25 mg  6.25 mg Intravenous Q5 min PRN Rosaria Ferries, MD      . misoprostol (CYTOTEC) 200 MCG tablet           . misoprostol (CYTOTEC) tablet 800 mcg  800 mcg Rectal Once PRN Farrel Conners, CNM      . nalbuphine (NUBAIN) injection 5 mg  5 mg Intravenous Q4H PRN Rosaria Ferries, MD       Or  . nalbuphine (NUBAIN) injection 5 mg  5 mg Subcutaneous Q4H PRN Rosaria Ferries, MD      . nalbuphine (NUBAIN) injection 5 mg  5 mg Intravenous Once PRN Rosaria Ferries, MD       Or  . nalbuphine (NUBAIN) injection 5 mg  5 mg Subcutaneous Once PRN Rosaria Ferries, MD       . naloxone [facility]) 2 mg in dextrose 5 % 250 mL infusion  1-4 mcg/kg/hr Intravenous Continuous PRN Rosaria Ferries, MD      . naloxone [facility]) injection 0.4 mg  0.4 mg Intravenous PRN Rosaria Ferries, MD       And  . sodium chloride 0.9 % injection 3 mL  3 mL Intravenous PRN Rosaria Ferries, MD      . ondansetron (ZOFRAN) injection 4 mg  4 mg Intravenous Q6H PRN Farrel Conners, CNM      . ondansetron (ZOFRAN) injection 4 mg  4 mg Intravenous Q8H PRN Rosaria Ferries, MD      . oxytocin (PITOCIN) IV BOLUS FROM BAG  500 mL Intravenous Continuous Farrel Conners, CNM      . oxytocin (PITOCIN) IV infusion 40 units in LR 1000 mL  62.5 mL/hr Intravenous Continuous Farrel Conners, CNM      . oxytocin (PITOCIN) IV infusion 40 units in LR 1000 mL  1-40 milli-units/min Intravenous Titrated Elenora Fender Ward, MD 9 mL/hr at 09/15/14 0415 6 milli-units/min at 09/15/14 0415  . scopolamine (TRANSDERM-SCOP) 1 MG/3DAYS 1.5 mg  1 patch Transdermal Once Rosaria Ferries, MD      . terbutaline (BRETHINE) injection 0.25 mg  0.25 mg Subcutaneous Once PRN Farrel Conners, CNM       Facility-Administered Medications Ordered in Other Encounters  Medication Dose Route Frequency Provider Last Rate Last Dose  . bupivacaine (PF) (MARCAINE) 0.25 % injection    Anesthesia Intra-op Rosaria Ferries, MD   5 mL at 09/15/14 0145  . lidocaine (PF) (XYLOCAINE) 1 % injection    Anesthesia Intra-op Rosaria Ferries, MD   1 mL at 09/15/14 0134  . lidocaine-EPINEPHrine 1.5 %-1:200000 injection    Anesthesia Intra-op Rosaria Ferries, MD   3 mL at 09/15/14 0138    Assessment & Plan:  Pt doing well *IUP:  Category I tracing *IOL: continue pit per protocol; poor maternal effort with pushing. Will labor down and hope to get more sensation. restart pushing 66m Pelvis feels adequatel h/o 8lbs 10oz infant  in 2006 *GDM: continue qhr bs checks, continue insulin gtt per protocol. Will have RN check more  recent BS and continue with qhr -8/8: 59%, 3000gm, normal AC *GBS: neg *Analgesia: epidural in place  Cornelia Copa MD [facility] Pager [phone]

## 2014-09-15 NOTE — Progress Notes (Addendum)
ANTIBIOTIC CONSULT NOTE - INITIAL  Pharmacy Consult for Gentamicin Indication: Chorioamnionitis  Allergies  Allergen Reactions  . Penicillin G Hives    Patient Measurements: Height:  (175.3 cm) Weight: 210 lb (95.255 kg) IBW/kg (Calculated) : 66.2 Adjusted Body Weight: 77.8 kg  Vital Signs: Temp: 101.3 F (38.5 C) (08/19 1307) Temp Source: Oral (08/19 1307) BP: 100/84 mmHg (08/19 1215) Pulse Rate: 119 (08/19 1215) Intake/Output from previous day: 08/18 0701 - 08/19 0700 In: 1691.4 [I.V.:1659.8] Out: -  Intake/Output from this shift: Total I/O In: -  Out: 1250 [Urine:1250]  Labs:  Recent Labs  09/13/14 2055  WBC 6.8  HGB 10.2*  PLT 226  CREATININE 0.65   Estimated Creatinine Clearance: 122.8 mL/min (by C-G formula based on Cr of 0.65). No results for input(s): VANCOTROUGH, VANCOPEAK, VANCORANDOM, GENTTROUGH, GENTPEAK, GENTRANDOM, TOBRATROUGH, TOBRAPEAK, TOBRARND, AMIKACINPEAK, AMIKACINTROU, AMIKACIN in the last 72 hours.   Kinetics:   Ke: 0.313  Vd: 23.3   Microbiology: No results found for this or any previous visit (from the past 720 hour(s)).  Medical History: Past Medical History  Diagnosis Date  . Anemia   . Gestational diabetes     Medications:  Scheduled:  . acetaminophen      . ammonia      . ampicillin (OMNIPEN) IV  2 g Intravenous 4 times per day  . diphenhydrAMINE  25 mg Oral Once  . famotidine  20 mg Oral BID  . gentamicin  120 mg Intravenous Q8H  . gentamicin  200 mg Intravenous Once  . lidocaine (PF)      . misoprostol      . scopolamine  1 patch Transdermal Once  . ampicillin IVPB 2 gram/NS 50 mL       Infusions:  . sodium chloride    . dextrose 100 mL/hr at 09/14/14 2327  . dextrose 5 % and 0.45% NaCl    . fentanyl 2.5 mcg/ml w/ropivacaine 0.2% in normal saline 100 mL EPIDURAL Infusion 8 mL/hr (09/15/14 1203)  . Insulin Regular infusion 100 units in 100 ml NS 0.5 mL/hr at 09/14/14 2327  . lactated ringers 20 mL/hr at  09/15/14 0401  . naLOXone [facility]) adult infusion for PRURITIS    . oxytocin 40 units in LR 1000 mL    . oxytocin 40 units in LR 1000 mL    . oxytocin 40 units in LR 1000 mL 6 milli-units/min (09/15/14 0415)   PRN: acetaminophen, ammonia, dextrose, diphenhydrAMINE **OR** diphenhydrAMINE, fentaNYL (SUBLIMAZE) injection, lactated ringers, lidocaine (PF), meperidine (DEMEROL) injection, misoprostol, nalbuphine **OR** nalbuphine, nalbuphine **OR** nalbuphine, naLOXone (NARCAN) adult infusion for PRURITIS, naloxone **AND** sodium chloride, ondansetron, ondansetron (ZOFRAN) IV, terbutaline  Assessment: 34 y/o F ordered ampicillin and gentamicin for chorioamnionitis.  Goal of Therapy:  Gentamicin peak 4-6 mcg/ml Gentamicin trough level <2 mcg/ml  Plan:  Gentamicin 200 mg load followed by 120 mg iv q 8 hours. Will check a peak and trough with the 3rd dose.   Luisa Hart D 09/15/2014,1:37 PM

## 2014-09-15 NOTE — Progress Notes (Signed)
Dr. Elesa Massed spoke with nursing supervisor and Pharmacist about insulin drip order. No glucose stabilizer needed at this time. Insulin drip continuing as written in orders. Patient is requesting an epidural, Dr. Elesa Massed wants to hold off on it for now, to give IV pain medicine as ordered.

## 2014-09-15 NOTE — Anesthesia Procedure Notes (Signed)
Epidural Patient location during procedure: OB Start time: 09/15/2014 1:34 AM End time: 09/15/2014 1:38 AM  Staffing Anesthesiologist: Margorie John K Performed by: anesthesiologist   Preanesthetic Checklist Completed: patient identified, site marked, surgical consent, pre-op evaluation, timeout performed, IV checked, risks and benefits discussed and monitors and equipment checked  Epidural Patient position: sitting Prep: ChloraPrep Patient monitoring: heart rate, continuous pulse ox and blood pressure Approach: midline Location: L3-L4 Injection technique: LOR saline  Needle:  Needle type: Tuohy  Needle gauge: 18 G Needle length: 9 cm and 9 Needle insertion depth: 5 cm Catheter type: closed end flexible Catheter size: 20 Guage Catheter at skin depth: 10 cm Test dose: negative and 1.5% lidocaine with Epi 1:200 K  Assessment Sensory level: T6 Events: blood not aspirated, injection not painful, no injection resistance, negative IV test and no paresthesia  Additional Notes   Patient tolerated the insertion well without complications.Reason for block:procedure for pain

## 2014-09-15 NOTE — Discharge Summary (Signed)
Obstetrical Discharge Summary  Date of Admission: 09/13/2014 Date of Discharge: 09/17/2014  Primary OB: [facility]  Gestational Age at Delivery: [redacted]w[redacted]d  Antepartum complications: gestational diabetes not well controlled/ noncompliant with diet Reason for Admission: Induction of labor for poorly controlled GDM Date of Delivery: 15 September 2014  Delivered By: CJesus Genera GDanise Mina CNM Delivery Type: spontaneous vaginal delivery Intrapartum complications/course: hyperglycemia, Chorio, uterine atony Anesthesia: epidural Placenta: spontaneous. Intact: yes. To pathology: yes.  Laceration: none Episiotomy: none Baby: Liveborn female, APGARs7/9, weight 8#6oz.   Postpartum course: Patient developed temperature shortly after delivery, treated for presumptive chorioamnionitis with 24-hrs of IV antibiotics.  Observed for an additional 24-hrs post antibiotics and noted to be afebrile in that time course without fundal tenderness. Remainder of postpartum course benign with patient remaining hemodynamically stable and otherwise afebrile.  Disposition: Home  Rh Immune globulin given: not applicable Rubella vaccine given: not applicable Tdap vaccine given in AP or PP setting: no Flu vaccine given in AP or PP setting: no  Contraception: to be discussed at 6 week postpartum visit  Prenatal/Postnatal Panel: B POS//Rubella Immune//Varicella Immune//RPR negative//HIV negative/HepB Surface Ag negative//pap    Plan:  LRichardson Landrywas discharged to home in good condition. Follow-up appointment with CDelories Heinzin 6 week  Discharge Medications:   Medication List    STOP taking these medications        ACCU-CHEK AVIVA PLUS test strip  Generic drug:  glucose blood     ACCU-CHEK AVIVA PLUS W/DEVICE Kit      TAKE these medications        ibuprofen 600 MG tablet  Commonly known as:  ADVIL,MOTRIN  Take 1 tablet (600 mg total) by mouth every 6 (six) hours.     prenatal multivitamin Tabs  tablet  Take 1 tablet by mouth daily at 12 noon.       AMalachy Mood MD

## 2014-09-15 NOTE — Progress Notes (Signed)
OB Note Category I tracing with mod variability, q20m UCs. Pushing to +4 with better effort. Head engaged. Anticipate SVD   Cornelia Copa MD [facility]  Pager: [phone]

## 2014-09-15 NOTE — Progress Notes (Signed)
L&D Note  09/15/2014 - 8:34 AM  34 y.o. G9F6213 [redacted]w[redacted]d (19wk u/s). Pregnancy complicated by poor patient compliance and refusal to check blood sugars, GDM, h/o STIs, BMI 32, umbilical hernia  Ms. Lavonna Rua is admitted for IOL due to poorly compliant GDM   Subjective:  Comfortable; no needs  Objective:    Current Vital Signs 24h Vital Sign Ranges  T 98.7 F (37.1 C) Temp  Avg: 98.6 F (37 C)  Min: 98.3 F (36.8 C)  Max: 98.7 F (37.1 C)  BP (!) 92/47 mmHg BP  Min: 63/35  Max: 120/66  HR 93 Pulse  Avg: 97.7  Min: 89  Max: 106  RR 18 Resp  Avg: 19  Min: 18  Max: 20  SaO2    98/RA No Data Recorded       24 Hour I/O Current Shift I/O  Time Ins Outs 08/18 0701 - 08/19 0700 In: 1691.4 [I.V.:1659.8] Out: -      FHR: 150 baseline, +accels, no decels, mod variability Toco: q2-94m Gen: NAD SVE: AROM BBOW, clear fluid; complete, DOA/0 station  Labs:   Recent Labs Lab 09/13/14 2055  WBC 6.8  HGB 10.2*  HCT 31.7*  PLT 226    Recent Labs Lab 09/13/14 2055  NA 134*  K 3.4*  CL 103  CO2 20*  BUN 9  CREATININE 0.65  CALCIUM 9.9  PROT 7.0  BILITOT 0.4  ALKPHOS 152*  ALT 14  AST 37  GLUCOSE 143*   Results for CHEY, RACHELS (MRN [id number]) as of 09/15/2014 08:38  Ref. Range 09/15/2014 01:58 09/15/2014 03:21 09/15/2014 04:45 09/15/2014 06:33 09/15/2014 07:56  Glucose-Capillary Latest Ref Range: 65-99 mg/dL 89 84 91 94 629 (H)    Medications Current Facility-Administered Medications  Medication Dose Route Frequency Provider Last Rate Last Dose  . 0.9 %  sodium chloride infusion   Intravenous Continuous Chelsea C Ward, MD      . ammonia inhalant 0.3 mL  0.3 mL Inhalation Once PRN Farrel Conners, CNM      . ammonia inhalant           . dextrose 5 % solution   Intravenous Continuous Elenora Fender Ward, MD 100 mL/hr at 09/14/14 2327    . dextrose 5 %-0.45 % sodium chloride infusion   Intravenous Continuous Chelsea C Ward, MD      . dextrose 50 % solution 25 mL  25 mL  Intravenous PRN Chelsea C Ward, MD      . diphenhydrAMINE (BENADRYL) capsule 25 mg  25 mg Oral Once Elenora Fender Ward, MD   25 mg at 09/14/14 2329  . diphenhydrAMINE (BENADRYL) injection 12.5 mg  12.5 mg Intravenous Q4H PRN Rosaria Ferries, MD       Or  . diphenhydrAMINE (BENADRYL) capsule 25 mg  25 mg Oral Q4H PRN Rosaria Ferries, MD      . famotidine (PEPCID) tablet 20 mg  20 mg Oral BID Farrel Conners, CNM   20 mg at 09/13/14 2336  . fentaNYL (SUBLIMAZE) injection 50-100 mcg  50-100 mcg Intravenous Q1H PRN Farrel Conners, CNM   100 mcg at 09/15/14 0042  . fentaNYL 2.5 mcg/ml w/ropivacaine 0.2% (preservative free) in normal saline 100 mL EPIDURAL Infusion in 150 ml Intravia Bag  10 mL/hr Epidural Continuous Rosaria Ferries, MD      . Insulin Regular infusion 100 units in 100 ml NS   Intravenous Continuous Elenora Fender Ward, MD 0.5 mL/hr at 09/14/14 2327    .  lactated ringers infusion 500-1,000 mL  500-1,000 mL Intravenous PRN Farrel Conners, CNM      . lactated ringers infusion   Intravenous Continuous Farrel Conners, CNM 20 mL/hr at 09/15/14 0401    . lidocaine (PF) (XYLOCAINE) 1 % injection 30 mL  30 mL Subcutaneous PRN Farrel Conners, CNM      . lidocaine (PF) (XYLOCAINE) 1 % injection           . meperidine (DEMEROL) injection 6.25 mg  6.25 mg Intravenous Q5 min PRN Rosaria Ferries, MD      . misoprostol (CYTOTEC) 200 MCG tablet           . misoprostol (CYTOTEC) tablet 800 mcg  800 mcg Rectal Once PRN Farrel Conners, CNM      . nalbuphine (NUBAIN) injection 5 mg  5 mg Intravenous Q4H PRN Rosaria Ferries, MD       Or  . nalbuphine (NUBAIN) injection 5 mg  5 mg Subcutaneous Q4H PRN Rosaria Ferries, MD      . nalbuphine (NUBAIN) injection 5 mg  5 mg Intravenous Once PRN Rosaria Ferries, MD       Or  . nalbuphine (NUBAIN) injection 5 mg  5 mg Subcutaneous Once PRN Rosaria Ferries, MD      . naloxone [facility]) 2 mg in dextrose 5 % 250 mL infusion  1-4  mcg/kg/hr Intravenous Continuous PRN Rosaria Ferries, MD      . naloxone [facility]) injection 0.4 mg  0.4 mg Intravenous PRN Rosaria Ferries, MD       And  . sodium chloride 0.9 % injection 3 mL  3 mL Intravenous PRN Rosaria Ferries, MD      . ondansetron (ZOFRAN) injection 4 mg  4 mg Intravenous Q6H PRN Farrel Conners, CNM      . ondansetron (ZOFRAN) injection 4 mg  4 mg Intravenous Q8H PRN Rosaria Ferries, MD      . oxytocin (PITOCIN) IV BOLUS FROM BAG  500 mL Intravenous Continuous Farrel Conners, CNM      . oxytocin (PITOCIN) IV infusion 40 units in LR 1000 mL  62.5 mL/hr Intravenous Continuous Farrel Conners, CNM      . oxytocin (PITOCIN) IV infusion 40 units in LR 1000 mL  1-40 milli-units/min Intravenous Titrated Elenora Fender Ward, MD 9 mL/hr at 09/15/14 0415 6 milli-units/min at 09/15/14 0415  . scopolamine (TRANSDERM-SCOP) 1 MG/3DAYS 1.5 mg  1 patch Transdermal Once Rosaria Ferries, MD      . terbutaline (BRETHINE) injection 0.25 mg  0.25 mg Subcutaneous Once PRN Farrel Conners, CNM       Facility-Administered Medications Ordered in Other Encounters  Medication Dose Route Frequency Provider Last Rate Last Dose  . bupivacaine (PF) (MARCAINE) 0.25 % injection    Anesthesia Intra-op Rosaria Ferries, MD   5 mL at 09/15/14 0145  . lidocaine (PF) (XYLOCAINE) 1 % injection    Anesthesia Intra-op Rosaria Ferries, MD   1 mL at 09/15/14 0134  . lidocaine-EPINEPHrine 1.5 %-1:200000 injection    Anesthesia Intra-op Rosaria Ferries, MD   3 mL at 09/15/14 0138    Assessment & Plan:  Pt doing well *IUP:  Category I tracing *IOL: continue pit per protocol; can hold off on pushing until pt has more pelvic sensation. Pelvis feels adequatel h/o 8lbs 10oz infant in 2006 *GDM: continue qhr bs checks, continue insulin gtt per protocol -8/8: 59%, 3000gm, normal AC *GBS: neg *Analgesia: epidural  in place; can turn down rate  [place] Bing, Montez Hageman MD [employer] Pager [phone]

## 2014-09-16 LAB — CBC
HEMATOCRIT: 28.6 % — AB (ref 35.0–47.0)
HEMOGLOBIN: 9.1 g/dL — AB (ref 12.0–16.0)
MCH: 25.8 pg — ABNORMAL LOW (ref 26.0–34.0)
MCHC: 31.7 g/dL — AB (ref 32.0–36.0)
MCV: 81.6 fL (ref 80.0–100.0)
Platelets: 209 10*3/uL (ref 150–440)
RBC: 3.51 MIL/uL — ABNORMAL LOW (ref 3.80–5.20)
RDW: 15.8 % — AB (ref 11.5–14.5)
WBC: 14.5 10*3/uL — ABNORMAL HIGH (ref 3.6–11.0)

## 2014-09-16 LAB — GENTAMICIN LEVEL, PEAK: GENTAMICIN PK: 4.8 ug/mL — AB (ref 5.0–10.0)

## 2014-09-16 LAB — GENTAMICIN LEVEL, TROUGH: Gentamicin Trough: <0.5 ug/mL — ABNORMAL LOW (ref 0.5–2.0)

## 2014-09-16 NOTE — Anesthesia Postprocedure Evaluation (Signed)
  Anesthesia Post-op Note  Patient: Doris West  Procedure(s) Performed: * No procedures listed *  Anesthesia type:No value filed.  Patient location: PACU  Post pain: Pain level controlled  Post assessment: Post-op Vital signs reviewed, Patient's Cardiovascular Status Stable, Respiratory Function Stable, Patent Airway and No signs of Nausea or vomiting  Post vital signs: Reviewed and stable  Last Vitals:  Filed Vitals:   09/16/14 0407  BP: 98/54  Pulse: 93  Temp: 36.8 C  Resp: 18    Level of consciousness: awake, alert  and patient cooperative  Complications: No apparent anesthesia complications

## 2014-09-16 NOTE — Anesthesia Post-op Follow-up Note (Signed)
  Anesthesia Pain Follow-up Note  Patient: Doris West  Day #: 1  Date of Follow-up: 09/16/2014 Time: 7:45 AM  Last Vitals:  Filed Vitals:   09/16/14 0407  BP: 98/54  Pulse: 93  Temp: 36.8 C  Resp: 18    Level of Consciousness: alert  Pain: none   Side Effects:None  Catheter Site Exam:clean, dry, no drainage  Plan: D/C from anesthesia care  Basilio Cairo

## 2014-09-16 NOTE — Progress Notes (Signed)
Subjective:  Doing well, minimal lochia, no fevers, no chills, no abdominal pain would like to go home  Objective:   Blood pressure 97/52, pulse 98, temperature 97.5 F (36.4 C), temperature source Oral, resp. rate 18, height '5\' 9"'  (1.753 m), weight 95.255 kg (210 lb), last menstrual period 12/25/2013, SpO2 97 %, unknown if currently breastfeeding.  General: NAD Pulmonary: no increased work of breathing Abdomen: non-distended, non-tender, fundus firm at level of umbilicus Extremities: no edema, no erythema, no tenderness  Results for orders placed or performed during the hospital encounter of 09/13/14 (from the past 72 hour(s))  CBC     Status: Abnormal   Collection Time: 09/13/14  8:55 PM  Result Value Ref Range   WBC 6.8 3.6 - 11.0 K/uL   RBC 3.86 3.80 - 5.20 MIL/uL   Hemoglobin 10.2 (L) 12.0 - 16.0 g/dL   HCT 31.7 (L) 35.0 - 47.0 %   MCV 82.2 80.0 - 100.0 fL   MCH 26.4 26.0 - 34.0 pg   MCHC 32.1 32.0 - 36.0 g/dL   RDW 16.0 (H) 11.5 - 14.5 %   Platelets 226 150 - 440 K/uL  Type and screen     Status: None   Collection Time: 09/13/14  8:55 PM  Result Value Ref Range   ABO/RH(D) B POS    Antibody Screen NEG    Sample Expiration 09/16/2014   Comprehensive metabolic panel     Status: Abnormal   Collection Time: 09/13/14  8:55 PM  Result Value Ref Range   Sodium 134 (L) 135 - 145 mmol/L   Potassium 3.4 (L) 3.5 - 5.1 mmol/L   Chloride 103 101 - 111 mmol/L   CO2 20 (L) 22 - 32 mmol/L   Glucose, Bld 143 (H) 65 - 99 mg/dL   BUN 9 6 - 20 mg/dL   Creatinine, Ser 0.65 0.44 - 1.00 mg/dL   Calcium 9.9 8.9 - 10.3 mg/dL   Total Protein 7.0 6.5 - 8.1 g/dL   Albumin 3.2 (L) 3.5 - 5.0 g/dL   AST 37 15 - 41 U/L   ALT 14 14 - 54 U/L   Alkaline Phosphatase 152 (H) 38 - 126 U/L   Total Bilirubin 0.4 0.3 - 1.2 mg/dL   GFR calc non Af Amer >60 >60 mL/min   GFR calc Af Amer >60 >60 mL/min    Comment: (NOTE) The eGFR has been calculated using the CKD EPI equation. This calculation has  not been validated in all clinical situations. eGFR's persistently <60 mL/min signify possible Chronic Kidney Disease.    Anion gap 11 5 - 15  RPR     Status: None   Collection Time: 09/13/14  8:56 PM  Result Value Ref Range   RPR Ser Ql Non Reactive Non Reactive    Comment: (NOTE) Performed At: [facility] [address] [place], [place] [id number] Lindon Romp MD DS:[id number]   ABO/Rh     Status: None   Collection Time: 09/13/14  8:56 PM  Result Value Ref Range   ABO/RH(D) B POS   Glucose, capillary     Status: Abnormal   Collection Time: 09/13/14 10:23 PM  Result Value Ref Range   Glucose-Capillary 135 (H) 65 - 99 mg/dL  Glucose, capillary     Status: Abnormal   Collection Time: 09/14/14 12:27 AM  Result Value Ref Range   Glucose-Capillary 110 (H) 65 - 99 mg/dL  Glucose, capillary     Status: Abnormal   Collection Time:  09/14/14  2:53 AM  Result Value Ref Range   Glucose-Capillary 102 (H) 65 - 99 mg/dL  Glucose, capillary     Status: None   Collection Time: 09/14/14  8:29 AM  Result Value Ref Range   Glucose-Capillary 95 65 - 99 mg/dL  Glucose, capillary     Status: None   Collection Time: 09/14/14 12:38 PM  Result Value Ref Range   Glucose-Capillary 80 65 - 99 mg/dL  Glucose, capillary     Status: Abnormal   Collection Time: 09/14/14  4:44 PM  Result Value Ref Range   Glucose-Capillary 150 (H) 65 - 99 mg/dL  Glucose, capillary     Status: Abnormal   Collection Time: 09/14/14  6:59 PM  Result Value Ref Range   Glucose-Capillary 134 (H) 65 - 99 mg/dL  Glucose, capillary     Status: None   Collection Time: 09/14/14  8:57 PM  Result Value Ref Range   Glucose-Capillary 98 65 - 99 mg/dL  Glucose, capillary     Status: None   Collection Time: 09/14/14 11:10 PM  Result Value Ref Range   Glucose-Capillary 82 65 - 99 mg/dL  Glucose, capillary     Status: None   Collection Time: 09/15/14 12:17 AM  Result Value Ref Range   Glucose-Capillary 86 65 - 99  mg/dL  Glucose, capillary     Status: None   Collection Time: 09/15/14  1:16 AM  Result Value Ref Range   Glucose-Capillary 86 65 - 99 mg/dL  Glucose, capillary     Status: None   Collection Time: 09/15/14  1:58 AM  Result Value Ref Range   Glucose-Capillary 89 65 - 99 mg/dL  Glucose, capillary     Status: None   Collection Time: 09/15/14  3:21 AM  Result Value Ref Range   Glucose-Capillary 84 65 - 99 mg/dL  Glucose, capillary     Status: None   Collection Time: 09/15/14  4:45 AM  Result Value Ref Range   Glucose-Capillary 91 65 - 99 mg/dL  Glucose, capillary     Status: None   Collection Time: 09/15/14  6:33 AM  Result Value Ref Range   Glucose-Capillary 94 65 - 99 mg/dL  Glucose, capillary     Status: Abnormal   Collection Time: 09/15/14  7:56 AM  Result Value Ref Range   Glucose-Capillary 100 (H) 65 - 99 mg/dL  Glucose, capillary     Status: None   Collection Time: 09/15/14 11:15 AM  Result Value Ref Range   Glucose-Capillary 86 65 - 99 mg/dL  CBC     Status: Abnormal   Collection Time: 09/16/14  5:38 AM  Result Value Ref Range   WBC 14.5 (H) 3.6 - 11.0 K/uL   RBC 3.51 (L) 3.80 - 5.20 MIL/uL   Hemoglobin 9.1 (L) 12.0 - 16.0 g/dL   HCT 28.6 (L) 35.0 - 47.0 %   MCV 81.6 80.0 - 100.0 fL   MCH 25.8 (L) 26.0 - 34.0 pg   MCHC 31.7 (L) 32.0 - 36.0 g/dL   RDW 15.8 (H) 11.5 - 14.5 %   Platelets 209 150 - 440 K/uL  Gentamicin level, trough     Status: Abnormal   Collection Time: 09/16/14  5:38 AM  Result Value Ref Range   Gentamicin Trough <0.5 (L) 0.5 - 2.0 ug/mL    Comment: RESULTS CONFIRMED BY MANUAL DILUTION  Gentamicin level, peak     Status: Abnormal   Collection Time: 09/16/14  7:25 AM  Result  Value Ref Range   Gentamicin Pk 4.8 (L) 5.0 - 10.0 ug/mL    Assessment:   34 y.o. G2P2002 postpartum day # 1 TSVD complicated by shoulder dystocia, chorio, and GDM  Plan:    1) Chorio - stop antibiotics, observe for 24-hrs verify doesn't respike  2) Continue routine  postpartum care, anticipate DC PPD2

## 2014-09-17 MED ORDER — METHYLERGONOVINE MALEATE 0.2 MG/ML IJ SOLN: 0.2000 mg | Freq: Once | INTRAMUSCULAR | Status: DC

## 2014-09-17 MED ORDER — PRENATAL MULTIVITAMIN CH: 1.0000 | ORAL_TABLET | Freq: Every day | ORAL | Status: DC

## 2014-09-17 MED ORDER — IBUPROFEN 600 MG PO TABS: 600.0000 mg | ORAL_TABLET | Freq: Four times a day (QID) | ORAL | Status: DC

## 2014-09-17 NOTE — Progress Notes (Signed)
Pt discharged home with infant.  Discharge instructions and follow up appointment given to and reviewed with pt.  Pt verbalized understanding.  Escorted by auxillary. 

## 2014-09-17 NOTE — Discharge Instructions (Signed)
Call your doctor for increased pain or vaginal bleeding, temperature above 100.4, depression, or concerns.  No strenuous activity or heavy lifting for 6 weeks.  No intercourse, tampons, douching, or enemas for 6 weeks.  No tub baths-showers only.  No driving for 2 weeks or while taking pain medications.  Continue prenatal vitamin and iron.  Increase calories and fluids while breastfeeding.  Vaginal Delivery, Care After Refer to this sheet in the next few weeks. These discharge instructions provide you with information on caring for yourself after delivery. Your caregiver may also give you specific instructions. Your treatment has been planned according to the most current medical practices available, but problems sometimes occur. Call your caregiver if you have any problems or questions after you go home. HOME CARE INSTRUCTIONS  Take over-the-counter or prescription medicines only as directed by your caregiver or pharmacist.  Do not drink alcohol, especially if you are breastfeeding or taking medicine to relieve pain.  Do not chew or smoke tobacco.  Do not use illegal drugs.  Continue to use good perineal care. Good perineal care includes:  Wiping your perineum from front to back.  Keeping your perineum clean.  Do not use tampons or douche until your caregiver says it is okay.  Shower, wash your hair, and take tub baths as directed by your caregiver.  Wear a well-fitting bra that provides breast support.  Eat healthy foods.  Drink enough fluids to keep your urine clear or pale yellow.  Eat high-fiber foods such as whole grain cereals and breads, brown rice, beans, and fresh fruits and vegetables every day. These foods may help prevent or relieve constipation.  Follow your caregiver's recommendations regarding resumption of activities such as climbing stairs, driving, lifting, exercising, or traveling.  Talk to your caregiver about resuming sexual activities. Resumption of sexual  activities is dependent upon your risk of infection, your rate of healing, and your comfort and desire to resume sexual activity.  Try to have someone help you with your household activities and your newborn for at least a few days after you leave the hospital.  Rest as much as possible. Try to rest or take a nap when your newborn is sleeping.  Increase your activities gradually.  Keep all of your scheduled postpartum appointments. It is very important to keep your scheduled follow-up appointments. At these appointments, your caregiver will be checking to make sure that you are healing physically and emotionally. SEEK MEDICAL CARE IF:   You are passing large clots from your vagina. Save any clots to show your caregiver.  You have a foul smelling discharge from your vagina.  You have trouble urinating.  You are urinating frequently.  You have pain when you urinate.  You have a change in your bowel movements.  You have increasing redness, pain, or swelling near your vaginal incision (episiotomy) or vaginal tear.  You have pus draining from your episiotomy or vaginal tear.  Your episiotomy or vaginal tear is separating.  You have painful, hard, or reddened breasts.  You have a severe headache.  You have blurred vision or see spots.  You feel sad or depressed.  You have thoughts of hurting yourself or your newborn.  You have questions about your care, the care of your newborn, or medicines.  You are dizzy or light-headed.  You have a rash.  You have nausea or vomiting.  You were breastfeeding and have not had a menstrual period within 12 weeks after you stopped breastfeeding.  You are  not breastfeeding and have not had a menstrual period by the 12th week after delivery.  You have a fever. SEEK IMMEDIATE MEDICAL CARE IF:   You have persistent pain.  You have chest pain.  You have shortness of breath.  You faint.  You have leg pain.  You have stomach  pain.  Your vaginal bleeding saturates two or more sanitary pads in 1 hour. MAKE SURE YOU:   Understand these instructions.  Will watch your condition.  Will get help right away if you are not doing well or get worse. Document Released: 01/11/2000 Document Revised: 05/30/2013 Document Reviewed: 09/10/2011 [facility] Patient Information 2015 [place], [place]. This information is not intended to replace advice given to you by your health care provider. Make sure you discuss any questions you have with your health care provider.

## 2014-09-17 NOTE — Progress Notes (Signed)
Pt stated during morning assessment that she will be leaving this hospital before 8:30 am no matter if the doctors have seen her or not.  She states that she doesn't need to see the Pediatrician here because the baby will have her own Pediatrician.  I told her that both her DR and the baby's DR need to see and release them before she can leave.  She stated that they better come before 8:30 am because she is getting out of this hospital.  Social Work is notified.

## 2014-09-19 LAB — SURGICAL PATHOLOGY

## 2014-10-01 ENCOUNTER — Inpatient Hospital Stay: Admission: RE | Admit: 2014-10-01 | Payer: Medicaid Other | Admitting: Obstetrics and Gynecology

## 2014-11-27 ENCOUNTER — Ambulatory Visit: Payer: Medicaid Other | Admitting: General Surgery

## 2014-11-28 DIAGNOSIS — T8859XA Other complications of anesthesia, initial encounter: Secondary | ICD-10-CM

## 2014-11-28 HISTORY — DX: Other complications of anesthesia, initial encounter: T88.59XA

## 2014-12-05 ENCOUNTER — Ambulatory Visit (INDEPENDENT_AMBULATORY_CARE_PROVIDER_SITE_OTHER): Payer: Medicaid Other | Admitting: General Surgery

## 2014-12-05 ENCOUNTER — Encounter: Payer: Self-pay | Admitting: General Surgery

## 2014-12-05 VITALS — BP 124/78 | HR 76 | Resp 12 | Ht 67.0 in | Wt 174.0 lb

## 2014-12-05 DIAGNOSIS — K439 Ventral hernia without obstruction or gangrene: Secondary | ICD-10-CM

## 2014-12-05 DIAGNOSIS — K429 Umbilical hernia without obstruction or gangrene: Secondary | ICD-10-CM | POA: Insufficient documentation

## 2014-12-05 NOTE — Progress Notes (Signed)
Patient ID: Doris RuaLaneisan S Finder, female   DOB: 08/15/1980, 34 y.o.   MRN: [id number]  Chief Complaint  Patient presents with  . Hernia    HPI Doris West is a 34 y.o. female.  Here today for evaluation of an umbilical hernia. She states the umbilical hernia has been there for about 10 year. She had a girl in August 2016. She states the area is tender when lifting and has got bigger over the years   HPI  Past Medical History  Diagnosis Date  . Anemia   . Gestational diabetes     Past Surgical History  Procedure Laterality Date  . Laparoscopic oopherectomy Left 2016    Family History  Problem Relation Age of Onset  . Diabetes Paternal Grandmother   . Cancer Father     lung    Social History Social History  Substance Use Topics  . Smoking status: Current Every Day Smoker -- 0.50 packs/day for 15 years    Types: Cigarettes    Last Attempt to Quit: 01/26/2014  . Smokeless tobacco: Never Used  . Alcohol Use: 0.0 oz/week    0 Standard drinks or equivalent per week     Comment: occasionally    Allergies  Allergen Reactions  . Penicillin G Hives    Current Outpatient Prescriptions  Medication Sig Dispense Refill  . ibuprofen (ADVIL,MOTRIN) 600 MG tablet Take 1 tablet (600 mg total) by mouth every 6 (six) hours. 30 tablet 0  . Prenatal Vit-Fe Fumarate-FA (PRENATAL MULTIVITAMIN) TABS tablet Take 1 tablet by mouth daily at 12 noon. 30 tablet 11   No current facility-administered medications for this visit.    Review of Systems Review of Systems  Constitutional: Negative.   Respiratory: Negative.   Cardiovascular: Negative.     Blood pressure 124/78, pulse 76, resp. rate 12, height 5\' 7"  (1.702 m), weight 174 lb (78.926 kg), last menstrual period 11/23/2014, unknown if currently breastfeeding.  Physical Exam Physical Exam  Constitutional: She is oriented to person, place, and time. She appears well-developed and well-nourished.  Eyes: Conjunctivae are normal. No  scleral icterus.  Neck: Neck supple.  Cardiovascular: Normal rate, regular rhythm and normal heart sounds.   Pulmonary/Chest: Effort normal and breath sounds normal.  Abdominal: Soft. Normal appearance and bowel sounds are normal. A hernia (5 mm defect above the umbilicus and small umbilical heria) is present.    Lymphadenopathy:    She has no cervical adenopathy.  Neurological: She is alert and oriented to person, place, and time.  Skin: Skin is warm and dry.     Assessment    Ventral and umbilical hernia.     Plan  Hernia precautions and incarceration were discussed with the patient. If they develop symptoms of an incarcerated hernia, they were encouraged to seek prompt medical attention.  I have recommended repair of the hernia on an outpatient basis in the near future. The risk of infection was reviewed. Based on the size of the hernia defects I think it's unlikely prosthetic mesh will be required.   Care with lifting after surgery was reviewed. She reports her child is now weighing about 13 pounds. Careful lifting technique discussed. Need for assistance in the home reviewed.  Patient is scheduled for surgery at [facility] on 12/14/14. She will pre admit by phone. Patient is aware of date and instructions.   PCP:  Monna Fam 12/05/2014, 9:23 PM

## 2014-12-05 NOTE — H&P (Signed)
HPI  Doris West is a 34 y.o. female. Here today for evaluation of an umbilical hernia. She states the umbilical hernia has been there for about 10 year. She had a girl in August 2016. She states the area is tender when lifting and has got bigger over the years  HPI  Past Medical History   Diagnosis  Date   .  Anemia    .  Gestational diabetes     Past Surgical History   Procedure  Laterality  Date   .  Laparoscopic oopherectomy  Left  2016    Family History   Problem  Relation  Age of Onset   .  Diabetes  Paternal Grandmother    .  Cancer  Father      lung    Social History  Social History   Substance Use Topics   .  Smoking status:  Current Every Day Smoker -- 0.50 packs/day for 15 years     Types:  Cigarettes     Last Attempt to Quit:  01/26/2014   .  Smokeless tobacco:  Never Used   .  Alcohol Use:  0.0 oz/week     0 Standard drinks or equivalent per week      Comment: occasionally    Allergies   Allergen  Reactions   .  Penicillin G  Hives    Current Outpatient Prescriptions   Medication  Sig  Dispense  Refill   .  ibuprofen (ADVIL,MOTRIN) 600 MG tablet  Take 1 tablet (600 mg total) by mouth every 6 (six) hours.  30 tablet  0   .  Prenatal Vit-Fe Fumarate-FA (PRENATAL MULTIVITAMIN) TABS tablet  Take 1 tablet by mouth daily at 12 noon.  30 tablet  11    No current facility-administered medications for this visit.    Review of Systems  Review of Systems  Constitutional: Negative.  Respiratory: Negative.  Cardiovascular: Negative.   Blood pressure 124/78, pulse 76, resp. rate 12, height  (1.702 m), weight 174 lb (78.926 kg), last menstrual period 11/23/2014, unknown if currently breastfeeding.  Physical Exam  Physical Exam  Constitutional: She is oriented to person, place, and time. She appears well-developed and well-nourished.  Eyes: Conjunctivae are normal. No scleral icterus.  Neck: Neck supple.  Cardiovascular: Normal rate, regular rhythm and normal  heart sounds.  Pulmonary/Chest: Effort normal and breath sounds normal.  Abdominal: Soft. Normal appearance and bowel sounds are normal. A hernia (5 mm defect above the umbilicus and small umbilical heria) is present.    Lymphadenopathy:  She has no cervical adenopathy.  Neurological: She is alert and oriented to person, place, and time.  Skin: Skin is warm and dry.   Assessment   Ventral and umbilical hernia.   Plan    Hernia precautions and incarceration were discussed with the patient. If they develop symptoms of an incarcerated hernia, they were encouraged to seek prompt medical attention.  I have recommended repair of the hernia on an outpatient basis in the near future. The risk of infection was reviewed. Based on the size of the hernia defects I think it's unlikely prosthetic mesh will be required.  Care with lifting after surgery was reviewed. She reports her child is now weighing about 13 pounds. Careful lifting technique discussed. Need for assistance in the home reviewed.  Patient is scheduled for surgery at [facility] on 12/14/14. She will pre admit by phone. Patient is aware of date and instructions.  PCP: Doris Ghent  West  Doris West, Doris West  12/05/2014, 9:23 PM

## 2014-12-05 NOTE — Patient Instructions (Addendum)
Hernia, Adult A hernia is the bulging of an organ or tissue through a weak spot in the muscles of the abdomen (abdominal wall). Hernias develop most often near the navel or groin. There are many kinds of hernias. Common kinds include:  Femoral hernia. This kind of hernia develops under the groin in the upper thigh area.  Inguinal hernia. This kind of hernia develops in the groin or scrotum.  Umbilical hernia. This kind of hernia develops near the navel.  Hiatal hernia. This kind of hernia causes part of the stomach to be pushed up into the chest.  Incisional hernia. This kind of hernia bulges through a scar from an abdominal surgery. CAUSES This condition may be caused by:  Heavy lifting.  Coughing over a long period of time.  Straining to have a bowel movement.  An incision made during an abdominal surgery.  A birth defect (congenital defect).  Excess weight or obesity.  Smoking.  Poor nutrition.  Cystic fibrosis.  Excess fluid in the abdomen.  Undescended testicles. SYMPTOMS Symptoms of a hernia include:  A lump on the abdomen. This is the first sign of a hernia. The lump may become more obvious with standing, straining, or coughing. It may get bigger over time if it is not treated or if the condition causing it is not treated.  Pain. A hernia is usually painless, but it may become painful over time if treatment is delayed. The pain is usually dull and may get worse with standing or lifting heavy objects. Sometimes a hernia gets tightly squeezed in the weak spot (strangulated) or stuck there (incarcerated) and causes additional symptoms. These symptoms may include:  Vomiting.  Nausea.  Constipation.  Irritability. DIAGNOSIS A hernia may be diagnosed with:  A physical exam. During the exam your health care provider may ask you to cough or to make a specific movement, because a hernia is usually more visible when you move.  Imaging tests. These can  include:  X-rays.  Ultrasound.  CT scan. TREATMENT A hernia that is small and painless may not need to be treated. A hernia that is large or painful may be treated with surgery. Inguinal hernias may be treated with surgery to prevent incarceration or strangulation. Strangulated hernias are always treated with surgery, because lack of blood to the trapped organ or tissue can cause it to die. Surgery to treat a hernia involves pushing the bulge back into place and repairing the weak part of the abdomen. HOME CARE INSTRUCTIONS  Avoid straining.  Do not lift anything heavier than 10 lb (4.5 kg).  Lift with your leg muscles, not your back muscles. This helps avoid strain.  When coughing, try to cough gently.  Prevent constipation. Constipation leads to straining with bowel movements, which can make a hernia worse or cause a hernia repair to break down. You can prevent constipation by:  Eating a high-fiber diet that includes plenty of fruits and vegetables.  Drinking enough fluids to keep your urine clear or pale yellow. Aim to drink 6-8 glasses of water per day.  Using a stool softener as directed by your health care provider.  Lose weight, if you are overweight.  Do not use any tobacco products, including cigarettes, chewing tobacco, or electronic cigarettes. If you need help quitting, ask your health care provider.  Keep all follow-up visits as directed by your health care provider. This is important. Your health care provider may need to monitor your condition. SEEK MEDICAL CARE IF:  You have   swelling, redness, and pain in the affected area.  Your bowel habits change. SEEK IMMEDIATE MEDICAL CARE IF:  You have a fever.  You have abdominal pain that is getting worse.  You feel nauseous or you vomit.  You cannot push the hernia back in place by gently pressing on it while you are lying down.  The hernia:  Changes in shape or size.  Is stuck outside the  abdomen.  Becomes discolored.  Feels hard or tender.   This information is not intended to replace advice given to you by your health care provider. Make sure you discuss any questions you have with your health care provider.   Document Released: 01/13/2005 Document Revised: 02/03/2014 Document Reviewed: 11/23/2013 Elsevier Interactive Patient Education [employer]2016 [employer]  Patient is scheduled [facility] on 12/14/14. She will pre admit by phone. Patient is aware of date and instructions.

## 2014-12-08 ENCOUNTER — Other Ambulatory Visit: Payer: Medicaid Other

## 2014-12-08 ENCOUNTER — Encounter: Payer: Self-pay | Admitting: *Deleted

## 2014-12-08 NOTE — Patient Instructions (Signed)
  Your procedure is scheduled on: 12-14-14 (THURSDAY) Report to [facility] 2ND FLOOR To find out your arrival time please call [phone] between 1PM - 3PM on 12-13-15 9 [facility](WEDNESDAY)  Remember: Instructions that are not followed completely may result in serious medical risk, up to and including death, or upon the discretion of your surgeon and anesthesiologist your surgery may need to be rescheduled.    _X___ 1. Do not eat food or drink liquids after midnight. No gum chewing or hard candies.     _X___ 2. No Alcohol for 24 hours before or after surgery.   ____ 3. Bring all medications with you on the day of surgery if instructed.    _X___ 4. Notify your doctor if there is any change in your medical condition     (cold, fever, infections).     Do not wear jewelry, make-up, hairpins, clips or nail polish.  Do not wear lotions, powders, or perfumes. You may wear deodorant.  Do not shave 48 hours prior to surgery. Men may shave face and neck.  Do not bring valuables to the hospital.    [facility] any belongings or valuables.               Contacts, dentures or bridgework may not be worn into surgery.  Leave your suitcase in the car. After surgery it may be brought to your room.  For patients admitted to the hospital, discharge time is determined by your treatment team.   Patients discharged the day of surgery will not be allowed to drive home.   Please read over the following fact sheets that you were given:    ____ Take these medicines the morning of surgery with A SIP OF WATER:    1.NONE  2.   3.   4.  5.  6.  ____ Fleet Enema (as directed)   _X___ Use CHG Soap as directed  ____ Use inhalers on the day of surgery  ____ Stop metformin 2 days prior to surgery    ____ Take 1/2 of usual insulin dose the night before surgery and none on the morning of surgery.   ____ Stop Coumadin/Plavix/aspirin-N/A  _X___ Stop  Anti-inflammatories--STOP IBUPROFEN NOW-NO NSAIDS OR ASA PRODUCTS-TYLENOL OK   ____ Stop supplements until after surgery.    ____ Bring C-Pap to the hospital.

## 2014-12-11 ENCOUNTER — Inpatient Hospital Stay: Admission: RE | Admit: 2014-12-11 | Payer: Medicaid Other | Source: Ambulatory Visit

## 2014-12-12 ENCOUNTER — Other Ambulatory Visit: Payer: Medicaid Other

## 2014-12-14 ENCOUNTER — Ambulatory Visit: Payer: Medicaid Other | Admitting: Anesthesiology

## 2014-12-14 ENCOUNTER — Encounter
Admission: RE | Disposition: A | Discharge status: Home / Self Care | Payer: Self-pay | Source: Ambulatory Visit | Attending: General Surgery

## 2014-12-14 ENCOUNTER — Ambulatory Visit
Admission: RE | Admit: 2014-12-14 | Discharge: 2014-12-14 | Disposition: A | Discharge status: Home / Self Care | Payer: Medicaid Other | Source: Ambulatory Visit | Attending: General Surgery | Admitting: General Surgery

## 2014-12-14 DIAGNOSIS — K219 Gastro-esophageal reflux disease without esophagitis: Secondary | ICD-10-CM | POA: Insufficient documentation

## 2014-12-14 DIAGNOSIS — K439 Ventral hernia without obstruction or gangrene: Secondary | ICD-10-CM

## 2014-12-14 DIAGNOSIS — F17210 Nicotine dependence, cigarettes, uncomplicated: Secondary | ICD-10-CM | POA: Diagnosis not present

## 2014-12-14 DIAGNOSIS — Z880 Allergy status to penicillin: Secondary | ICD-10-CM | POA: Insufficient documentation

## 2014-12-14 DIAGNOSIS — K429 Umbilical hernia without obstruction or gangrene: Secondary | ICD-10-CM | POA: Insufficient documentation

## 2014-12-14 DIAGNOSIS — O9089 Other complications of the puerperium, not elsewhere classified: Secondary | ICD-10-CM | POA: Diagnosis not present

## 2014-12-14 HISTORY — PX: VENTRAL HERNIA REPAIR: SHX424

## 2014-12-14 HISTORY — PX: UMBILICAL HERNIA REPAIR: SHX196

## 2014-12-14 LAB — HEMOGLOBIN: HEMOGLOBIN: 13 g/dL (ref 12.0–16.0)

## 2014-12-14 LAB — GLUCOSE, CAPILLARY
GLUCOSE-CAPILLARY: 91 mg/dL (ref 65–99)
Glucose-Capillary: 79 mg/dL (ref 65–99)

## 2014-12-14 LAB — POCT PREGNANCY, URINE: Preg Test, Ur: NEGATIVE

## 2014-12-14 SURGERY — REPAIR, HERNIA, UMBILICAL, ADULT
Anesthesia: General | Wound class: Clean

## 2014-12-14 MED ORDER — FAMOTIDINE 20 MG PO TABS
20.0000 mg | ORAL_TABLET | Freq: Once | ORAL | Status: AC
  Administered 2014-12-14: 20 mg via ORAL

## 2014-12-14 MED ORDER — LACTATED RINGERS IV SOLN
INTRAVENOUS | Status: DC
  Administered 2014-12-14 (×2): via INTRAVENOUS

## 2014-12-14 MED ORDER — OXYCODONE HCL 5 MG/5ML PO SOLN: 5.0000 mg | Freq: Once | ORAL | Status: AC | PRN

## 2014-12-14 MED ORDER — HYDROCODONE-ACETAMINOPHEN 5-325 MG PO TABS: 1.0000 | ORAL_TABLET | ORAL | Status: DC | PRN

## 2014-12-14 MED ORDER — FENTANYL CITRATE (PF) 100 MCG/2ML IJ SOLN
INTRAMUSCULAR | Status: DC | PRN
  Administered 2014-12-14: 25 ug via INTRAVENOUS
  Administered 2014-12-14 (×2): 50 ug via INTRAVENOUS
  Administered 2014-12-14: 25 ug via INTRAVENOUS

## 2014-12-14 MED ORDER — FENTANYL CITRATE (PF) 100 MCG/2ML IJ SOLN
25.0000 ug | INTRAMUSCULAR | Status: DC | PRN
  Administered 2014-12-14: 25 ug via INTRAVENOUS
  Administered 2014-12-14: 50 ug via INTRAVENOUS
  Administered 2014-12-14: 25 ug via INTRAVENOUS

## 2014-12-14 MED ORDER — SODIUM CHLORIDE 0.9 % IJ SOLN
INTRAMUSCULAR | Status: AC
  Filled 2014-12-14: qty 10

## 2014-12-14 MED ORDER — PROMETHAZINE HCL 25 MG/ML IJ SOLN: 12.5000 mg | INTRAMUSCULAR | Status: DC | PRN

## 2014-12-14 MED ORDER — CEFAZOLIN SODIUM-DEXTROSE 2-3 GM-% IV SOLR
INTRAVENOUS | Status: AC
  Administered 2014-12-14: 2 g via INTRAVENOUS
  Filled 2014-12-14: qty 50

## 2014-12-14 MED ORDER — PROPOFOL 10 MG/ML IV BOLUS
INTRAVENOUS | Status: DC | PRN
  Administered 2014-12-14: 170 mg via INTRAVENOUS

## 2014-12-14 MED ORDER — CEFAZOLIN SODIUM-DEXTROSE 2-3 GM-% IV SOLR
INTRAVENOUS | Status: DC | PRN
  Administered 2014-12-14: 2 g via INTRAVENOUS

## 2014-12-14 MED ORDER — OXYCODONE HCL 5 MG PO TABS
5.0000 mg | ORAL_TABLET | Freq: Once | ORAL | Status: AC | PRN
  Administered 2014-12-14: 5 mg via ORAL

## 2014-12-14 MED ORDER — BUPIVACAINE HCL 0.5 % IJ SOLN
INTRAMUSCULAR | Status: DC | PRN
  Administered 2014-12-14: 30 mL

## 2014-12-14 MED ORDER — CEFAZOLIN SODIUM-DEXTROSE 2-3 GM-% IV SOLR
2.0000 g | INTRAVENOUS | Status: AC
  Administered 2014-12-14: 2 g via INTRAVENOUS

## 2014-12-14 MED ORDER — PROMETHAZINE HCL 25 MG/ML IJ SOLN
INTRAMUSCULAR | Status: AC
  Administered 2014-12-14: 6.25 mg
  Filled 2014-12-14: qty 1

## 2014-12-14 MED ORDER — OXYCODONE HCL 5 MG PO TABS
ORAL_TABLET | ORAL | Status: AC
  Filled 2014-12-14: qty 1

## 2014-12-14 MED ORDER — ONDANSETRON HCL 4 MG/2ML IJ SOLN
INTRAMUSCULAR | Status: DC | PRN
  Administered 2014-12-14: 4 mg via INTRAVENOUS

## 2014-12-14 MED ORDER — FENTANYL CITRATE (PF) 100 MCG/2ML IJ SOLN
INTRAMUSCULAR | Status: AC
  Filled 2014-12-14: qty 2

## 2014-12-14 MED ORDER — MIDAZOLAM HCL 2 MG/2ML IJ SOLN
INTRAMUSCULAR | Status: DC | PRN
  Administered 2014-12-14: 2 mg via INTRAVENOUS

## 2014-12-14 MED ORDER — BUPIVACAINE HCL (PF) 0.5 % IJ SOLN
INTRAMUSCULAR | Status: AC
  Filled 2014-12-14: qty 30

## 2014-12-14 MED ORDER — DEXAMETHASONE SODIUM PHOSPHATE 4 MG/ML IJ SOLN
INTRAMUSCULAR | Status: DC | PRN
  Administered 2014-12-14: 8 mg via INTRAVENOUS

## 2014-12-14 MED ORDER — LIDOCAINE HCL (CARDIAC) 20 MG/ML IV SOLN
INTRAVENOUS | Status: DC | PRN
Start: 2014-12-14 — End: 2014-12-14
  Administered 2014-12-14: 40 mg via INTRAVENOUS

## 2014-12-14 MED ORDER — ACETAMINOPHEN 10 MG/ML IV SOLN
INTRAVENOUS | Status: DC | PRN
  Administered 2014-12-14: 1000 mg via INTRAVENOUS

## 2014-12-14 MED ORDER — KETOROLAC TROMETHAMINE 30 MG/ML IJ SOLN
INTRAMUSCULAR | Status: DC | PRN
  Administered 2014-12-14: 30 mg via INTRAVENOUS

## 2014-12-14 MED ORDER — ACETAMINOPHEN 10 MG/ML IV SOLN
INTRAVENOUS | Status: AC
  Filled 2014-12-14: qty 100

## 2014-12-14 MED ORDER — FAMOTIDINE 20 MG PO TABS
ORAL_TABLET | ORAL | Status: AC
  Administered 2014-12-14: 20 mg via ORAL
  Filled 2014-12-14: qty 1

## 2014-12-14 SURGICAL SUPPLY — 42 items
BLADE SURG 15 STRL SS SAFETY (BLADE) ×3 IMPLANT
CANISTER SUCT 1200ML W/VALVE (MISCELLANEOUS) ×3 IMPLANT
CATH TRAY 16F METER LATEX (MISCELLANEOUS) ×1 IMPLANT
CHLORAPREP W/TINT 26ML (MISCELLANEOUS) ×3 IMPLANT
CLOSURE WOUND 1/2 X4 (GAUZE/BANDAGES/DRESSINGS) ×1
DRAIN CHANNEL JP 15F RND 16 (MISCELLANEOUS) ×2 IMPLANT
DRAPE CHEST BREAST 77X106 FENE (MISCELLANEOUS) ×1 IMPLANT
DRAPE LAPAROTOMY 100X77 ABD (DRAPES) ×3 IMPLANT
DRESSING TELFA 4X3 1S ST N-ADH (GAUZE/BANDAGES/DRESSINGS) ×1 IMPLANT
DRSG TEGADERM 2-3/8X2-3/4 SM (GAUZE/BANDAGES/DRESSINGS) ×2 IMPLANT
DRSG TEGADERM 4X4.75 (GAUZE/BANDAGES/DRESSINGS) ×4 IMPLANT
DRSG TELFA 3X8 NADH (GAUZE/BANDAGES/DRESSINGS) ×3 IMPLANT
GAUZE SPONGE 4X4 12PLY STRL (GAUZE/BANDAGES/DRESSINGS) ×1 IMPLANT
GLOVE BIO SURGEON STRL SZ7.5 (GLOVE) ×5 IMPLANT
GLOVE INDICATOR 8.0 STRL GRN (GLOVE) ×5 IMPLANT
GOWN STRL REUS W/ TWL LRG LVL3 (GOWN DISPOSABLE) ×2 IMPLANT
GOWN STRL REUS W/TWL LRG LVL3 (GOWN DISPOSABLE) ×6
KIT RM TURNOVER STRD PROC AR (KITS) ×3 IMPLANT
LABEL OR SOLS (LABEL) ×1 IMPLANT
MESH VENTRALEX ST 8CM LRG (Mesh General) ×2 IMPLANT
NDL HYPO 25X1 1.5 SAFETY (NEEDLE) ×1 IMPLANT
NDL SAFETY 22GX1.5 (NEEDLE) ×4 IMPLANT
NEEDLE HYPO 25X1 1.5 SAFETY (NEEDLE) ×3 IMPLANT
NS IRRIG 500ML POUR BTL (IV SOLUTION) ×3 IMPLANT
PACK BASIN MINOR ARMC (MISCELLANEOUS) ×3 IMPLANT
PAD DRESSING TELFA 3X8 NADH (GAUZE/BANDAGES/DRESSINGS) ×1 IMPLANT
PAD GROUND ADULT SPLIT (MISCELLANEOUS) ×3 IMPLANT
SPONGE LAP 18X18 5 PK (GAUZE/BANDAGES/DRESSINGS) ×1 IMPLANT
STAPLER SKIN PROX 35W (STAPLE) ×1 IMPLANT
STRIP CLOSURE SKIN 1/2X4 (GAUZE/BANDAGES/DRESSINGS) ×2 IMPLANT
SUT PROLENE 0 CT 1 30 (SUTURE) ×1 IMPLANT
SUT SURGILON 0 BLK (SUTURE) ×6 IMPLANT
SUT VIC AB 2-0 BRD 54 (SUTURE) ×1 IMPLANT
SUT VIC AB 3-0 54X BRD REEL (SUTURE) ×2 IMPLANT
SUT VIC AB 3-0 BRD 54 (SUTURE) ×3
SUT VIC AB 3-0 SH 27 (SUTURE) ×3
SUT VIC AB 3-0 SH 27X BRD (SUTURE) ×1 IMPLANT
SUT VIC AB 4-0 FS2 27 (SUTURE) ×3 IMPLANT
SUT VICRYL+ 3-0 144IN (SUTURE) ×3 IMPLANT
SWABSTK COMLB BENZOIN TINCTURE (MISCELLANEOUS) ×3 IMPLANT
SYR 3ML LL SCALE MARK (SYRINGE) ×3 IMPLANT
SYR CONTROL 10ML (SYRINGE) ×4 IMPLANT

## 2014-12-14 NOTE — Progress Notes (Signed)
Ice pack to pt ABD.

## 2014-12-14 NOTE — Transfer of Care (Signed)
Immediate Anesthesia Transfer of Care Note  Patient: Doris West  Procedure(s) Performed: Procedure(s): HERNIA REPAIR UMBILICAL ADULT (N/A) HERNIA REPAIR EPIGASTRIC  ADULT (N/A)  Patient Location: PACU  Anesthesia Type:General  Level of Consciousness: sedated  Airway & Oxygen Therapy: Patient Spontanous Breathing and Patient connected to face mask oxygen  Post-op Assessment: Report given to RN and Post -op Vital signs reviewed and stable  Post vital signs: Reviewed and stable  Last Vitals:  Filed Vitals:   12/14/14 1039  BP: 118/60  Pulse: 51  Temp: 36.4 C  Resp: 18    Complications: No apparent anesthesia complications

## 2014-12-14 NOTE — Progress Notes (Signed)
Awake and alert, Dr Lemar LivingsByrnett in to see

## 2014-12-14 NOTE — Anesthesia Postprocedure Evaluation (Signed)
  Anesthesia Post-op Note  Patient: Doris West  Procedure(s) Performed: Procedure(s): HERNIA REPAIR UMBILICAL ADULT (N/A) HERNIA REPAIR EPIGASTRIC  ADULT (N/A)  Anesthesia type:General ETT  Patient location: PACU  Post pain: Pain level controlled  Post assessment: Post-op Vital signs reviewed, Patient's Cardiovascular Status Stable, Respiratory Function Stable, Patent Airway and No signs of Nausea or vomiting  Post vital signs: Reviewed and stable  Last Vitals:  Filed Vitals:   12/14/14 1224  BP: 116/79  Pulse: 52  Temp: 35.7 C  Resp: 18    Level of consciousness: awake, alert  and patient cooperative  Complications: No apparent anesthesia complications

## 2014-12-14 NOTE — Anesthesia Preprocedure Evaluation (Signed)
Anesthesia Evaluation  Patient identified by MRN, date of birth, ID band Patient awake    Reviewed: Allergy & Precautions, H&P , NPO status , Patient's Chart, lab work & pertinent test results  Airway Mallampati: II  TM Distance: >3 FB Neck ROM: full    Dental  (+) Poor Dentition, Chipped   Pulmonary neg shortness of breath, Current Smoker,    Pulmonary exam normal breath sounds clear to auscultation       Cardiovascular Exercise Tolerance: Good (-) angina(-) Past MI and (-) DOE negative cardio ROS Normal cardiovascular exam Rhythm:regular Rate:Normal     Neuro/Psych negative neurological ROS  negative psych ROS   GI/Hepatic Neg liver ROS, GERD  Controlled,  Endo/Other  diabetes  Renal/GU negative Renal ROS  negative genitourinary   Musculoskeletal   Abdominal   Peds  Hematology negative hematology ROS (+)   Anesthesia Other Findings Past Medical History:   Anemia                                                       Gestational diabetes                                        Past Surgical History:   LAPAROSCOPIC OOPHERECTOMY                       Left 2016        BMI    Body Mass Index   27.24 kg/m 2    Signs and symptoms suggestive of sleep apnea    Reproductive/Obstetrics negative OB ROS                             Anesthesia Physical Anesthesia Plan  ASA: III  Anesthesia Plan: General ETT   Post-op Pain Management:    Induction:   Airway Management Planned:   Additional Equipment:   Intra-op Plan:   Post-operative Plan:   Informed Consent: I have reviewed the patients History and Physical, chart, labs and discussed the procedure including the risks, benefits and alternatives for the proposed anesthesia with the patient or authorized representative who has indicated his/her understanding and acceptance.   Dental Advisory Given  Plan Discussed with:  Anesthesiologist, CRNA and Surgeon  Anesthesia Plan Comments:         Anesthesia Quick Evaluation

## 2014-12-14 NOTE — Anesthesia Procedure Notes (Signed)
Procedure Name: LMA Insertion Date/Time: 12/14/2014 9:00 AM Performed by: Rosaria FerriesPISCITELLO, JOSEPH K Pre-anesthesia Checklist: Patient identified, Emergency Drugs available, Suction available, Patient being monitored and Timeout performed Patient Re-evaluated:Patient Re-evaluated prior to inductionOxygen Delivery Method: Circle system utilized Preoxygenation: Pre-oxygenation with 100% oxygen Intubation Type: IV induction LMA: LMA inserted LMA Size: 4.0 Number of attempts: 1 Tube secured with: Tape Dental Injury: Teeth and Oropharynx as per pre-operative assessment

## 2014-12-14 NOTE — H&P (Signed)
No change in clinical exam or history since office visit.  For repair of epigastric and umbilical hernia defects.

## 2014-12-14 NOTE — Op Note (Signed)
Preoperative diagnosis: Ventral and umbilical hernia.  Postoperative diagnosis: Same.  Operative procedure: Repair of ventral and umbilical hernia with 8 cm X ST preperitoneal mesh.  Operating surgeon: Lane HackerJeffery Statia Burdick, M.D.  Anesthesia: Gen. by LMA, Marcaine 0.5%, plain, 30 mL local infiltration.  Estimated blood loss: Less than 5 mL.  Clinical note: This 34 year old woman who is now postpartum and has developed an enlarging ventral hernia 5 cm above the level of the umbilicus. Also noted was a small umbilical fascial defect. She was made for elective repair. She received Kefzol prior to procedure. SCD stockings for DVT prevention.  Operative note: With the patient under adequate general anesthesia the abdomen was prepped with ChloraPrep and draped. The area of the epigastric ventral hernia had previously been identified was marked. Full glass effect was infiltrated for postoperative analgesia. The skin was incised sharply. The soft tissue was dissected bluntly and preperitoneal fat was identified. A sizable transversely orientated 1 cm long 4 cm wide fascial defect was noted. Multiple areas of lobulated fat protruding through this as well as a hernia sac. The preperitoneal fat was excised and discarded. The  peritoneum was intact. There was scarring in the area of the umbilicus as this was carefully dissected. After exposing the small 5 mm fascial defect of the umbilicus this was rocked dated with a single 0 Surgilon figure-of-eight suture placed from the undersurface of the fascia. An 8 cm ventral ex mesh was then smoothed into the preperitoneal space. This was tacked circumferentially with interrupted 0 Surgilon sutures to the fascia above and the ring of the mesh below. The midline fascial defect was then approximated in its transverse orientation with interrupted 0 Surgilon sutures. The adipose layer was approximated with interrupted 3-0 Vicryl figure-of-eight sutures to obliterate the dead space  previously occupied by the hernia. The superficial adipose layer was closed with a running 3-0 Vicryl suture. The skin was closed with a running 4-Vicryl 0 septic suture. Benzoin, Steri-Strip, Telfa and Tegaderm dressing applied.  The patient tolerated the procedure well and was taken to recovery in stable condition.

## 2014-12-15 ENCOUNTER — Telehealth: Payer: Self-pay | Admitting: General Surgery

## 2014-12-15 NOTE — Telephone Encounter (Signed)
Patient reports calling at 2 PM, no call back.  Wanted to report swollen, tender eye lids. No visual disturbance reported. Reported sore shoulders as well. No complaint at the hernia site.  No meds in eye that I am aware of, possibly had tape to keep eyes closed during procedure.  Open procedure, so no CO2 intraperitoneal. Arms out, but not hyperextended.  Encouraged to use benadryl and ice to the ices. Encouraged to use Aleve/ heat for her shoulders.  Hydrocodone if needed.

## 2014-12-27 ENCOUNTER — Encounter: Payer: Self-pay | Admitting: General Surgery

## 2014-12-27 ENCOUNTER — Ambulatory Visit (INDEPENDENT_AMBULATORY_CARE_PROVIDER_SITE_OTHER): Payer: Medicaid Other | Admitting: General Surgery

## 2014-12-27 VITALS — BP 118/66 | HR 86 | Resp 14 | Ht 67.5 in | Wt 169.0 lb

## 2014-12-27 DIAGNOSIS — K429 Umbilical hernia without obstruction or gangrene: Secondary | ICD-10-CM

## 2014-12-27 DIAGNOSIS — K439 Ventral hernia without obstruction or gangrene: Secondary | ICD-10-CM

## 2014-12-27 NOTE — Progress Notes (Signed)
Patient ID: Doris West, female   DOB: 03/31/1980, 34 y.o.   MRN: [id number]  Chief Complaint  Patient presents with  . Routine Post Op    hernia    HPI Doris West is a 34 y.o. female.  Here today for postoperative visit, umbilical and ventral hernia repair on 12-14-14. Staets she is doing well. Past few days a little more pain because she has been doing more.  I personally reviewed her postoperative history. HPI  Past Medical History  Diagnosis Date  . Anemia   . Gestational diabetes     Past Surgical History  Procedure Laterality Date  . Laparoscopic oopherectomy Left 2016  . Umbilical hernia repair N/A 12/14/2014    Procedure: HERNIA REPAIR UMBILICAL ADULT;  Surgeon: Earline MayotteJeffrey West Caden Fukushima, MD;  Location: [facility] ORS;  Service: General;  Laterality: N/A;  . Ventral hernia repair N/A 12/14/2014    Procedure: HERNIA REPAIR EPIGASTRIC  ADULT;  Surgeon: Earline MayotteJeffrey West Lilianna Case, MD;  Location: [facility] ORS;  Service: General;  Laterality: N/A;    Family History  Problem Relation Age of Onset  . Diabetes Paternal Grandmother   . Cancer Father     lung    Social History Social History  Substance Use Topics  . Smoking status: Current Every Day Smoker -- 0.50 packs/day for 15 years    Types: Cigarettes    Last Attempt to Quit: 01/26/2014  . Smokeless tobacco: Never Used  . Alcohol Use: 0.0 oz/week    0 Standard drinks or equivalent per week     Comment: occasionally    Allergies  Allergen Reactions  . Penicillin G Hives  . Prescott Gumuna [Fish Allergy] Hives    Current Outpatient Prescriptions  Medication Sig Dispense Refill  . ibuprofen (ADVIL,MOTRIN) 600 MG tablet Take 1 tablet (600 mg total) by mouth every 6 (six) hours. 30 tablet 0  . Prenatal Vit-Fe Fumarate-FA (PRENATAL MULTIVITAMIN) TABS tablet Take 1 tablet by mouth daily at 12 noon. 30 tablet 11   No current facility-administered medications for this visit.    Review of Systems Review of Systems  Constitutional: Negative.    Respiratory: Negative.   Cardiovascular: Negative.     Blood pressure 118/66, pulse 86, resp. rate 14, height 5' 7.5" (1.715 m), weight 169 lb (76.658 kg), last menstrual period 11/23/2014, not currently breastfeeding.  Physical Exam Physical Exam  Abdominal:         Assessment    Doing well status post repair of umbilical and supraumbilical hernia with prosthetic mesh.    Plan    The patient may increase her activity as she is comfortable. Abdominal exercises have been discouraged to the first of the year. Care with lifting reviewed.    Follow up as needed. Resume activities as tolerated.   PCP:  Audria NineBender, Abby  Doris West 12/27/2014, 1:23 PM

## 2014-12-27 NOTE — Patient Instructions (Signed)
The patient is aware to call back for any questions or concerns.  

## 2015-06-04 ENCOUNTER — Encounter: Payer: Self-pay | Admitting: *Deleted

## 2015-06-04 ENCOUNTER — Other Ambulatory Visit: Payer: Medicaid Other

## 2015-06-04 NOTE — Patient Instructions (Signed)
  Your procedure is scheduled on: 06-08-15 (FRIDAY) Report to [facility] SAME [facility] 2ND FLOOR To find out your arrival time please call [phone] between 1PM - 3PM on 06-07-15 (THURSDAY)  Remember: Instructions that are not followed completely may result in serious medical risk, up to and including death, or upon the discretion of your surgeon and anesthesiologist your surgery may need to be rescheduled.    _X___ 1. Do not eat food or drink liquids after midnight. No gum chewing or hard candies.     _X___ 2. No Alcohol for 24 hours before or after surgery.   ____ 3. Bring all medications with you on the day of surgery if instructed.    _X___ 4. Notify your doctor if there is any change in your medical condition     (cold, fever, infections).     Do not wear jewelry, make-up, hairpins, clips or nail polish.  Do not wear lotions, powders, or perfumes. You may wear deodorant.  Do not shave 48 hours prior to surgery. Men may shave face and neck.  Do not bring valuables to the hospital.    [facility] is not responsible for any belongings or valuables.               Contacts, dentures or bridgework may not be worn into surgery.  Leave your suitcase in the car. After surgery it may be brought to your room.  For patients admitted to the hospital, discharge time is determined by your treatment team.   Patients discharged the day of surgery will not be allowed to drive home.   Please read over the following fact sheets that you were given:      ____ Take these medicines the morning of surgery with A SIP OF WATER:    1. NONE  2.   3.   4.  5.  6.  ____ Fleet Enema (as directed)   _X___ Use CHG Soap as directed  ____ Use inhalers on the day of surgery  ____ Stop metformin 2 days prior to surgery    ____ Take 1/2 of usual insulin dose the night before surgery and none on the morning of surgery.   ____ Stop Coumadin/Plavix/aspirin-N/A  _X___ Stop  Anti-inflammatories-NO NSAIDS OR ASPIRIN PRODUCTS-TYLENOL OK TO TAKE   ____ Stop supplements until after surgery.    ____ Bring C-Pap to the hospital.  

## 2015-06-05 ENCOUNTER — Inpatient Hospital Stay: Admission: RE | Admit: 2015-06-05 | Payer: Medicaid Other | Source: Ambulatory Visit

## 2015-06-06 ENCOUNTER — Encounter
Admission: RE | Admit: 2015-06-06 | Discharge: 2015-06-06 | Disposition: A | Discharge status: Home / Self Care | Payer: Medicaid Other | Source: Ambulatory Visit | Attending: Podiatry | Admitting: Podiatry

## 2015-06-06 DIAGNOSIS — Z01812 Encounter for preprocedural laboratory examination: Secondary | ICD-10-CM | POA: Insufficient documentation

## 2015-06-06 LAB — BASIC METABOLIC PANEL
ANION GAP: 7 (ref 5–15)
BUN: 12 mg/dL (ref 6–20)
CHLORIDE: 108 mmol/L (ref 101–111)
CO2: 23 mmol/L (ref 22–32)
Calcium: 9 mg/dL (ref 8.9–10.3)
Creatinine, Ser: 0.95 mg/dL (ref 0.44–1.00)
GFR calc non Af Amer: >60 mL/min (ref >60)
Glucose, Bld: 92 mg/dL (ref 65–99)
POTASSIUM: 3.8 mmol/L (ref 3.5–5.1)
SODIUM: 138 mmol/L (ref 135–145)

## 2015-06-06 LAB — CBC
HEMATOCRIT: 39.8 % (ref 35.0–47.0)
HEMOGLOBIN: 13.2 g/dL (ref 12.0–16.0)
MCH: 29.5 pg (ref 26.0–34.0)
MCHC: 33.2 g/dL (ref 32.0–36.0)
MCV: 88.9 fL (ref 80.0–100.0)
PLATELETS: 196 10*3/uL (ref 150–440)
RBC: 4.47 MIL/uL (ref 3.80–5.20)
RDW: 14.1 % (ref 11.5–14.5)
WBC: 4.2 10*3/uL (ref 3.6–11.0)

## 2015-06-06 LAB — DIFFERENTIAL
Basophils Absolute: 0 10*3/uL (ref 0–0.1)
Basophils Relative: 1 %
Eosinophils Absolute: 0.1 10*3/uL (ref 0–0.7)
LYMPHS ABS: 1.9 10*3/uL (ref 1.0–3.6)
MONO ABS: 0.4 10*3/uL (ref 0.2–0.9)
Monocytes Relative: 9 %
NEUTROS ABS: 1.7 10*3/uL (ref 1.4–6.5)
Neutrophils Relative %: 41 %

## 2015-06-07 ENCOUNTER — Encounter: Payer: Self-pay | Admitting: *Deleted

## 2015-06-08 ENCOUNTER — Encounter: Payer: Self-pay | Admitting: Anesthesiology

## 2015-06-08 ENCOUNTER — Ambulatory Visit: Payer: Medicaid Other | Admitting: Anesthesiology

## 2015-06-08 ENCOUNTER — Encounter
Admission: RE | Disposition: A | Discharge status: Home / Self Care | Payer: Self-pay | Source: Ambulatory Visit | Attending: Podiatry

## 2015-06-08 ENCOUNTER — Ambulatory Visit
Admission: RE | Admit: 2015-06-08 | Discharge: 2015-06-08 | Disposition: A | Discharge status: Home / Self Care | Payer: Medicaid Other | Source: Ambulatory Visit | Attending: Podiatry | Admitting: Podiatry

## 2015-06-08 DIAGNOSIS — F17200 Nicotine dependence, unspecified, uncomplicated: Secondary | ICD-10-CM | POA: Insufficient documentation

## 2015-06-08 DIAGNOSIS — Z833 Family history of diabetes mellitus: Secondary | ICD-10-CM | POA: Diagnosis not present

## 2015-06-08 DIAGNOSIS — Z793 Long term (current) use of hormonal contraceptives: Secondary | ICD-10-CM | POA: Diagnosis not present

## 2015-06-08 DIAGNOSIS — Z8249 Family history of ischemic heart disease and other diseases of the circulatory system: Secondary | ICD-10-CM | POA: Insufficient documentation

## 2015-06-08 DIAGNOSIS — M2011 Hallux valgus (acquired), right foot: Secondary | ICD-10-CM | POA: Diagnosis present

## 2015-06-08 DIAGNOSIS — Z880 Allergy status to penicillin: Secondary | ICD-10-CM | POA: Insufficient documentation

## 2015-06-08 HISTORY — DX: Adverse effect of unspecified anesthetic, initial encounter: T41.45XA

## 2015-06-08 HISTORY — DX: Cardiac murmur, unspecified: R01.1

## 2015-06-08 HISTORY — PX: BUNIONECTOMY: SHX129

## 2015-06-08 LAB — URINE DRUG SCREEN, QUALITATIVE (ARMC ONLY)
Amphetamines, Ur Screen: NOT DETECTED
Barbiturates, Ur Screen: NOT DETECTED
Benzodiazepine, Ur Scrn: NOT DETECTED
CANNABINOID 50 NG, UR ~~LOC~~: POSITIVE — AB
Cocaine Metabolite,Ur ~~LOC~~: NOT DETECTED
MDMA (ECSTASY) UR SCREEN: NOT DETECTED
Methadone Scn, Ur: NOT DETECTED
Opiate, Ur Screen: NOT DETECTED
PHENCYCLIDINE (PCP) UR S: NOT DETECTED
Tricyclic, Ur Screen: NOT DETECTED

## 2015-06-08 SURGERY — BUNIONECTOMY
Anesthesia: Monitor Anesthesia Care | Site: Foot | Laterality: Right | Wound class: Clean

## 2015-06-08 MED ORDER — LACTATED RINGERS IV SOLN
INTRAVENOUS | Status: DC
  Administered 2015-06-08 (×3): via INTRAVENOUS

## 2015-06-08 MED ORDER — PROPOFOL 500 MG/50ML IV EMUL
INTRAVENOUS | Status: DC | PRN
  Administered 2015-06-08: 150 ug/kg/min via INTRAVENOUS

## 2015-06-08 MED ORDER — FAMOTIDINE 20 MG PO TABS
20.0000 mg | ORAL_TABLET | Freq: Once | ORAL | Status: AC
  Administered 2015-06-08: 20 mg via ORAL

## 2015-06-08 MED ORDER — ONDANSETRON HCL 4 MG/2ML IJ SOLN: 4.0000 mg | Freq: Once | INTRAMUSCULAR | Status: DC | PRN

## 2015-06-08 MED ORDER — BUPIVACAINE HCL (PF) 0.5 % IJ SOLN
INTRAMUSCULAR | Status: AC
  Filled 2015-06-08: qty 30

## 2015-06-08 MED ORDER — OXYCODONE-ACETAMINOPHEN 5-325 MG PO TABS: 1.0000 | ORAL_TABLET | ORAL | Status: DC | PRN

## 2015-06-08 MED ORDER — BUPIVACAINE HCL (PF) 0.5 % IJ SOLN
INTRAMUSCULAR | Status: DC | PRN
  Administered 2015-06-08: 15 mL

## 2015-06-08 MED ORDER — PROPOFOL 10 MG/ML IV BOLUS
INTRAVENOUS | Status: DC | PRN
  Administered 2015-06-08 (×4): 20 mg via INTRAVENOUS
  Administered 2015-06-08: 30 mg via INTRAVENOUS
  Administered 2015-06-08 (×5): 20 mg via INTRAVENOUS

## 2015-06-08 MED ORDER — FAMOTIDINE 20 MG PO TABS
ORAL_TABLET | ORAL | Status: AC
  Filled 2015-06-08: qty 1

## 2015-06-08 MED ORDER — NEOMYCIN-POLYMYXIN B GU 40-200000 IR SOLN
Status: AC
  Filled 2015-06-08: qty 2

## 2015-06-08 MED ORDER — HYDROMORPHONE HCL 1 MG/ML IJ SOLN: 0.2500 mg | INTRAMUSCULAR | Status: DC | PRN

## 2015-06-08 MED ORDER — LIDOCAINE HCL (CARDIAC) 20 MG/ML IV SOLN
INTRAVENOUS | Status: DC | PRN
  Administered 2015-06-08: 60 mg via INTRAVENOUS

## 2015-06-08 MED ORDER — CEFAZOLIN SODIUM-DEXTROSE 2-4 GM/100ML-% IV SOLN
2.0000 g | Freq: Once | INTRAVENOUS | Status: AC
  Administered 2015-06-08: 2 g via INTRAVENOUS

## 2015-06-08 MED ORDER — LIDOCAINE HCL (PF) 1 % IJ SOLN
INTRAMUSCULAR | Status: AC
  Filled 2015-06-08: qty 30

## 2015-06-08 MED ORDER — FENTANYL CITRATE (PF) 100 MCG/2ML IJ SOLN
INTRAMUSCULAR | Status: DC | PRN
  Administered 2015-06-08 (×2): 50 ug via INTRAVENOUS

## 2015-06-08 MED ORDER — CEFAZOLIN SODIUM-DEXTROSE 2-4 GM/100ML-% IV SOLN
INTRAVENOUS | Status: AC
  Filled 2015-06-08: qty 100

## 2015-06-08 MED ORDER — MIDAZOLAM HCL 2 MG/2ML IJ SOLN
INTRAMUSCULAR | Status: DC | PRN
  Administered 2015-06-08: 2 mg via INTRAVENOUS

## 2015-06-08 MED ORDER — PHENYLEPHRINE HCL 10 MG/ML IJ SOLN
INTRAMUSCULAR | Status: DC | PRN
  Administered 2015-06-08 (×4): 100 ug via INTRAVENOUS

## 2015-06-08 SURGICAL SUPPLY — 58 items
BAG COUNTER SPONGE EZ (MISCELLANEOUS) ×1 IMPLANT
BAG SPNG 4X4 CLR HAZ (MISCELLANEOUS)
BANDAGE ELASTIC 4 LF NS (GAUZE/BANDAGES/DRESSINGS) ×3 IMPLANT
BANDAGE STRETCH 3X4.1 STRL (GAUZE/BANDAGES/DRESSINGS) ×6 IMPLANT
BIT DRILL CANN 3.0 (BIT) ×2 IMPLANT
BIT DRILL TWST CANN 2.2X1.87MM (DRILL) IMPLANT
BLADE MED AGGRESSIVE (BLADE) ×5 IMPLANT
BLADE OSC/SAGITTAL MD 5.5X18 (BLADE) ×3 IMPLANT
BLADE SURG 15 STRL LF DISP TIS (BLADE) ×2 IMPLANT
BLADE SURG 15 STRL SS (BLADE) ×6
BNDG CMPR MED 5X4 ELC HKLP NS (GAUZE/BANDAGES/DRESSINGS) ×1
BNDG ESMARK 4X12 TAN STRL LF (GAUZE/BANDAGES/DRESSINGS) ×3 IMPLANT
CANISTER SUCT 1200ML W/VALVE (MISCELLANEOUS) ×3 IMPLANT
CLOSURE WOUND 1/4X4 (GAUZE/BANDAGES/DRESSINGS) ×1
COUNTER SPONGE BAG EZ (MISCELLANEOUS)
CUFF TOURN 18 STER (MISCELLANEOUS) ×1 IMPLANT
CUFF TOURN DUAL PL 12 NO SLV (MISCELLANEOUS) ×3 IMPLANT
DRAPE FLUOR MINI C-ARM 54X84 (DRAPES) ×3 IMPLANT
DRILL TWIST CANN 2.2X1.87MM (DRILL) ×3
DRSG TELFA 3X8 NADH (GAUZE/BANDAGES/DRESSINGS) ×3 IMPLANT
DURAPREP 26ML APPLICATOR (WOUND CARE) ×3 IMPLANT
ELECT REM PT RETURN 9FT ADLT (ELECTROSURGICAL) ×3
ELECTRODE REM PT RTRN 9FT ADLT (ELECTROSURGICAL) ×1 IMPLANT
GAUZE PETRO XEROFOAM 1X8 (MISCELLANEOUS) ×3 IMPLANT
GAUZE SPONGE 4X4 12PLY STRL (GAUZE/BANDAGES/DRESSINGS) ×3 IMPLANT
GLOVE BIO SURGEON STRL SZ7.5 (GLOVE) ×3 IMPLANT
GLOVE INDICATOR 8.0 STRL GRN (GLOVE) ×3 IMPLANT
GOWN STRL REUS W/ TWL LRG LVL3 (GOWN DISPOSABLE) ×2 IMPLANT
GOWN STRL REUS W/TWL LRG LVL3 (GOWN DISPOSABLE) ×6
K-WIRE DOUBLE 1.1 (WIRE) ×3
KWIRE DOUBLE 1.1 (WIRE) IMPLANT
LABEL OR SOLS (LABEL) ×3 IMPLANT
NDL FILTER BLUNT 18X1 1/2 (NEEDLE) ×1 IMPLANT
NDL HYPO 25X1 1.5 SAFETY (NEEDLE) ×2 IMPLANT
NEEDLE FILTER BLUNT 18X 1/2SAF (NEEDLE) ×2
NEEDLE FILTER BLUNT 18X1 1/2 (NEEDLE) ×1 IMPLANT
NEEDLE HYPO 25X1 1.5 SAFETY (NEEDLE) ×6 IMPLANT
NS IRRIG 500ML POUR BTL (IV SOLUTION) ×3 IMPLANT
PACK EXTREMITY ARMC (MISCELLANEOUS) ×3 IMPLANT
PAD CAST CTTN 4X4 STRL (SOFTGOODS) ×1 IMPLANT
PAD DRESSING TELFA 3X8 NADH (GAUZE/BANDAGES/DRESSINGS) ×1 IMPLANT
PADDING CAST COTTON 4X4 STRL (SOFTGOODS) ×3
PENCIL ELECTRO HAND CTR (MISCELLANEOUS) ×3 IMPLANT
RASP SM TEAR CROSS CUT (RASP) ×5 IMPLANT
SCREW CAN COMPR 3.0X19MM (Screw) ×2 IMPLANT
SOL PREP PVP 2OZ (MISCELLANEOUS) ×3
SOLUTION PREP PVP 2OZ (MISCELLANEOUS) ×1 IMPLANT
SPLINT FAST PLASTER 5X30 (CAST SUPPLIES) ×2
SPLINT PLASTER CAST FAST 5X30 (CAST SUPPLIES) ×1 IMPLANT
STOCKINETTE STRL 6IN 960660 (GAUZE/BANDAGES/DRESSINGS) ×3 IMPLANT
STRAP SAFETY BODY (MISCELLANEOUS) ×3 IMPLANT
STRIP CLOSURE SKIN 1/4X4 (GAUZE/BANDAGES/DRESSINGS) ×2 IMPLANT
SUT VIC AB 4-0 FS2 27 (SUTURE) ×6 IMPLANT
SWABSTK COMLB BENZOIN TINCTURE (MISCELLANEOUS) ×3 IMPLANT
SYRINGE 10CC LL (SYRINGE) ×3 IMPLANT
WIRE G 0.8X100 TROCAR 2 (WIRE) ×2 IMPLANT
WIRE Z .045 C-WIRE SPADE TIP (WIRE) ×6 IMPLANT
WIRE Z .062 C-WIRE SPADE TIP (WIRE) ×3 IMPLANT

## 2015-06-08 NOTE — Op Note (Signed)
Date of operation: 06/08/2015.  Surgeon: Ricci Barkerodd W Dannielle Baskins DPM.  Preoperative diagnosis: Hallux valgus deformity right foot.  Postoperative diagnosis: Same.  Procedure: [place] hallux valgus correction right first metatarsal.  Anesthesia: Local Mac.  Hemostasis: Pneumatic tourniquet right ankle 250 mmHg.  Estimated blood loss: Less than 5 cc.  Pathology: None.  Materials: One 3.0 mm, 19 mm length Medartis screw  Complications: None apparent.  Operative indications: This is a 35 year old female with a complaint of chronic bunion on her right foot. He has tried conservative treatment and elects to have surgical correction.  Operative procedure: Patient was taken to the operating room and placed on the table in the supine position. Following satisfactory sedation the right foot was anesthetized with 15 cc of 0.5% bupivacaine plain around the first metatarsal. A pneumatic tourniquet was applied at the level of the right ankle and the foot was prepped and draped in the usual sterile fashion. The foot was exsanguinated and the tourniquet inflated to 250 mmHg. Attention was then directed to the distal aspect of the right foot where an approximate 5 cm linear incision was made coursing proximal to distal centered over the first metatarsal and metatarsophalangeal joint. The incision was deepened via sharp and blunt dissection down to the level of the joint where a linear capsulotomy was performed. Capsular periosteal tissues reflected off of the medial and dorsal head of the first metatarsal. There was noted to be some cartilage loss along the dorsal aspect of the joint with some mild prominent spur formation dorsally. Using a power saw the medial eminence was resected in toto as well as the dorsal prominence with care taken to remove the area of damaged cartilage. There was also noted to be some thinning of the cartilage along the plantar medial groove. A wire was then driven from medial to lateral as an  axis guide and a V-shaped osteotomy was performed through the head of the first metatarsal. Capital fragment was freely mobilized and the pin was removed. Attention was then directed to the lateral aspect of the joint where a lateral capsulotomy with freeing of the sesamoid apparatus was performed. Good release of contracture on the great toe. Attention was directed back medially where the capital fragment was transposed laterally and fixated using a wire from the Medartis screw set. Intraoperative FluoroScan views revealed good reduction of the deformity and wire placement. 3.0 mm 19 mm length Medartis compression screw was then inserted into the first metatarsal using standard technique. The redundant bone medially was resected and the edges were rasped smooth. A drop FluoroScan views revealed good alignment of the first ray with fixation of the osteotomy site. Then flushed with copious amounts of sterile saline and closed using 4-0 Vicryl running suture for all layers from capsular and periosteal tissue to deep and superficial subcutaneous followed by skin closure. Tincture of benzoin and Steri-Strips applied to the incision followed by Xeroform and a sterile bandage. The tourniquet was released and blood flow noted to return to medially to the right foot and all digits. A posterior splint was then applied with the foot 90 relative to the leg. Patient tolerated the procedure and anesthesia well and was transported to the PACU with vital signs stable and in good condition.

## 2015-06-08 NOTE — Anesthesia Preprocedure Evaluation (Addendum)
Anesthesia Evaluation  Patient identified by MRN, date of birth, ID band Patient awake    Reviewed: Allergy & Precautions, NPO status , Patient's Chart, lab work & pertinent test results  Airway Mallampati: I  TM Distance: >3 FB Neck ROM: Full    Dental  (+) Teeth Intact   Pulmonary Current Smoker,    Pulmonary exam normal        Cardiovascular Exercise Tolerance: Good negative cardio ROS Normal cardiovascular exam     Neuro/Psych    GI/Hepatic   Endo/Other  Gestationalneg diabetes  Renal/GU      Musculoskeletal   Abdominal Normal abdominal exam  (+)   Peds  Hematology   Anesthesia Other Findings   Reproductive/Obstetrics                            Anesthesia Physical Anesthesia Plan  ASA: II  Anesthesia Plan: MAC   Post-op Pain Management:    Induction:   Airway Management Planned: Simple Face Mask  Additional Equipment:   Intra-op Plan:   Post-operative Plan:   Informed Consent: I have reviewed the patients History and Physical, chart, labs and discussed the procedure including the risks, benefits and alternatives for the proposed anesthesia with the patient or authorized representative who has indicated his/her understanding and acceptance.     Plan Discussed with: CRNA  Anesthesia Plan Comments: (MAC local--heart murmur is asymptomatic and not always heard.)        Anesthesia Quick Evaluation

## 2015-06-08 NOTE — H&P (Signed)
Medical history and physical in the chart was reviewed. No interval changes. Patient stable for surgery  

## 2015-06-08 NOTE — Anesthesia Postprocedure Evaluation (Signed)
Anesthesia Post Note  Patient: Doris West  Procedure(s) Performed: Procedure(s) (LRB): BUNIONECTOMY/ hallux valgus right foot (Right)  Patient location during evaluation: PACU Anesthesia Type: General and MAC Level of consciousness: awake and alert Pain management: pain level controlled Vital Signs Assessment: post-procedure vital signs reviewed and stable Respiratory status: spontaneous breathing Cardiovascular status: blood pressure returned to baseline Postop Assessment: no headache Anesthetic complications: no    Last Vitals:  Filed Vitals:   06/08/15 1150 06/08/15 1200  BP: 108/74 113/88  Pulse: 63 66  Temp:  36.4 C  Resp: 17 18    Last Pain:  Filed Vitals:   06/08/15 1202  PainSc: 0-No pain                 Harm Jou M

## 2015-06-08 NOTE — Transfer of Care (Signed)
Immediate Anesthesia Transfer of Care Note  Patient: Doris West  Procedure(s) Performed: Procedure(s): BUNIONECTOMY/ hallux valgus right foot (Right)  Patient Location: PACU  Anesthesia Type:MAC and MAC combined with regional for post-op pain  Level of Consciousness: awake  Airway & Oxygen Therapy: Patient Spontanous Breathing  Post-op Assessment: Report given to RN  Post vital signs: stable  Last Vitals:  Filed Vitals:   06/08/15 0847  BP: 107/71  Pulse: 76  Temp: 36.6 C  Resp: 18    Last Pain: There were no vitals filed for this visit.       Complications: No apparent anesthesia complications

## 2015-06-08 NOTE — Discharge Instructions (Addendum)
AMBULATORY SURGERY  DISCHARGE INSTRUCTIONS   1) The drugs that you were given will stay in your system until tomorrow so for the next 24 hours you should not:  A) Drive an automobile B) Make any legal decisions C) Drink any alcoholic beverage   2) You may resume regular meals tomorrow.  Today it is better to start with liquids and gradually work up to solid foods.  You may eat anything you prefer, but it is better to start with liquids, then soup and crackers, and gradually work up to solid foods.   3) Please notify your doctor immediately if you have any unusual bleeding, trouble breathing, redness and pain at the surgery site, drainage, fever, or pain not relieved by medication.    4) Additional Instructions:   1. Elevate the right leg on 2 pillows.  2. Keep the bandage on the right foot clean, dry, and do not remove.  3. Sponge bathe only right lower extremity.  4. Take one pain pill, Percocet, every 4 hours as needed for pain.  5. Wear surgical shoe on the right foot whenever walking or standing.     Please contact your physician with any problems or Same Day Surgery at [phone], Monday through Friday 6 am to 4 pm, or [place] at [facility] Main number at [phone].

## 2015-06-08 NOTE — Interval H&P Note (Signed)
History and Physical Interval Note:  06/08/2015 9:22 AM  Doris West  has presented today for surgery, with the diagnosis of Hallux Valgus M20.11  The various methods of treatment have been discussed with the patient and family. After consideration of risks, benefits and other options for treatment, the patient has consented to  Procedure(s): BUNIONECTOMY/ hallux valgus right foot (Right) as a surgical intervention .  The patient's history has been reviewed, patient examined, no change in status, stable for surgery.  I have reviewed the patient's chart and labs.  Questions were answered to the patient's satisfaction.     Ricci Barkerodd W Amilliana Hayworth

## 2015-06-18 ENCOUNTER — Encounter: Payer: Self-pay | Admitting: Podiatry

## 2015-09-27 ENCOUNTER — Encounter: Payer: Self-pay | Admitting: *Deleted

## 2015-10-04 ENCOUNTER — Ambulatory Visit (INDEPENDENT_AMBULATORY_CARE_PROVIDER_SITE_OTHER): Payer: Medicaid Other | Admitting: General Surgery

## 2015-10-04 ENCOUNTER — Encounter: Payer: Self-pay | Admitting: General Surgery

## 2015-10-04 VITALS — BP 130/82 | HR 68 | Resp 12 | Ht 67.0 in | Wt 156.0 lb

## 2015-10-04 DIAGNOSIS — R1033 Periumbilical pain: Secondary | ICD-10-CM

## 2015-10-04 DIAGNOSIS — R634 Abnormal weight loss: Secondary | ICD-10-CM | POA: Diagnosis not present

## 2015-10-04 NOTE — Patient Instructions (Addendum)
The patient is aware to call back for any questions or concerns. May consider adding a nutritional supplement to her routine.

## 2015-10-04 NOTE — Progress Notes (Signed)
Patient ID: Doris West, female   DOB: 1980-11-11, 35 y.o.   MRN: [id number]  Chief Complaint  Patient presents with  . Abdominal Pain    HPI Doris West is a 35 y.o. female.  Here today for evaluation of abdominal pain. Bowels move every 2 days. She admits to loss of appetite since surgery Nov 2016 and having a child August 2016. The pain "pin pricks" is all around the belly button. Denies nausea or vomiting. She still sees some swelling. Cutting sensation when bending over. She admits to 20 pounds weight loss since surgery. She has no interest in eating. Denies pain with meals.  HPI  Past Medical History:  Diagnosis Date  . Anemia   . Complication of anesthesia 11-2014   AFTER HERNIA SURGERY PT STATES SHE HAD TROUBLE SEEING, HER EYES WERE VERY SENSITIVE- THIS LASTED 12 HOURS AFTER HER SURGERY  . Gestational diabetes   . Heart murmur    ASYMPTOMATIC    Past Surgical History:  Procedure Laterality Date  . BUNIONECTOMY Right 06/08/2015   Procedure: BUNIONECTOMY/ hallux valgus right foot;  Surgeon: Linus Galas, DPM;  Location: [facility] ORS;  Service: Podiatry;  Laterality: Right;  . LAPAROSCOPIC OOPHERECTOMY Left 2016  . UMBILICAL HERNIA REPAIR N/A 12/14/2014   Procedure: HERNIA REPAIR UMBILICAL ADULT;  Surgeon: Earline Mayotte, MD;  Location: [facility] OR[facility];  Service: General;  Laterality: N/A;  . VENTRAL HERNIA REPAIR N/A 12/14/2014   Procedure: HERNIA REPAIR EPIGASTRIC  ADULT;  Surgeon: Earline Mayotte, MD;  Location: [facility] ORS;  Service: General;  Laterality: N/A;    Family History  Problem Relation Age of Onset  . Diabetes Paternal Grandmother   . Cancer Father     lung    Social History Social History  Substance Use Topics  . Smoking status: Current Every Day Smoker    Packs/day: 0.25    Years: 15.00    Types: Cigarettes    Last attempt to quit: 01/26/2014  . Smokeless tobacco: Never Used  . Alcohol use 0.0 oz/week     Comment: occasionally    Allergies  Allergen  Reactions  . Penicillin G Hives and Swelling  . [place] Gum [Fish Allergy] Hives    Current Outpatient Prescriptions  Medication Sig Dispense Refill  . ibuprofen (ADVIL,MOTRIN) 600 MG tablet TAKE 1 TABLET BY MOUTH EVERY 6 HOURS AS NEEDED WITH FOOD     No current facility-administered medications for this visit.     Review of Systems Review of Systems  Constitutional: Positive for appetite change.  Respiratory: Negative.   Cardiovascular: Negative.   Gastrointestinal: Positive for abdominal pain. Negative for diarrhea, nausea and vomiting.    Blood pressure 130/82, pulse 68, resp. rate 12, height 5\' 7"  (1.702 m), weight 156 lb (70.8 kg), last menstrual period 07/04/2015, not currently breastfeeding.  Physical Exam Physical Exam  Constitutional: She is oriented to person, place, and time. She appears well-developed and well-nourished.  HENT:  Mouth/Throat: Oropharynx is clear and moist.  Eyes: Conjunctivae are normal. No scleral icterus.  Neck: Neck supple.  Cardiovascular: Normal rate, regular rhythm and normal heart sounds.   Pulmonary/Chest: Effort normal and breath sounds normal.  Abdominal: Soft. Normal appearance and bowel sounds are normal. There is no tenderness. No hernia.  Diastasis recti present.  Lymphadenopathy:    She has no cervical adenopathy.  Neurological: She is alert and oriented to person, place, and time.  Skin: Skin is warm and dry.  Psychiatric: Her behavior is normal.  Data Reviewed Operative report of 12/14/2014: Epigastric hernia with preperitoneal fat measuring 1 x 4 cm, small umbilical fascial defect, 5 mm. 8 cm ventral ex mesh placed in the preperitoneal space.  Assessment    No evidence of recurrent ventral hernia.    Plan    Patient exhibits a general lack of interest in food rather than postprandial pain. The peritoneal cavity was never entered so there is little likelihood of any intra-abdominal adhesions.    Follow up as needed. May  consider adding a nutritional supplement to her routine.  The patient was encouraged to follow up with her primary care physician.  The patient is aware to call back for any questions or concerns.   This information has been scribed by Dorathy DaftMarsha Hatch RN, BSN,BC.   Earline MayotteByrnett, Lexie Koehl W 10/06/2015, 2:28 PM

## 2015-10-06 DIAGNOSIS — R109 Unspecified abdominal pain: Secondary | ICD-10-CM | POA: Insufficient documentation

## 2016-04-18 ENCOUNTER — Other Ambulatory Visit: Payer: Self-pay | Admitting: Obstetrics and Gynecology

## 2016-04-29 ENCOUNTER — Ambulatory Visit: Payer: Medicaid Other | Admitting: General Surgery

## 2016-06-27 ENCOUNTER — Telehealth: Payer: Self-pay | Admitting: Obstetrics & Gynecology

## 2016-06-27 NOTE — Telephone Encounter (Signed)
Pt is being [facility] for [facility] BV. Left voicemail for pt to call back to be schedule

## 2016-07-02 NOTE — Telephone Encounter (Signed)
Attempted to reach pt to schedule. Unable to leave voicemail due to voicemail not set up

## 2016-07-04 NOTE — Telephone Encounter (Signed)
Pt is schedule 07/08/16 with Helmut MusterAlicia Copland

## 2016-07-08 ENCOUNTER — Ambulatory Visit: Payer: Self-pay | Admitting: Obstetrics and Gynecology

## 2017-07-29 ENCOUNTER — Emergency Department
Admission: EM | Admit: 2017-07-29 | Discharge: 2017-07-29 | Disposition: A | Discharge status: Home / Self Care | Payer: Medicaid Other | Attending: Emergency Medicine | Admitting: Emergency Medicine

## 2017-07-29 ENCOUNTER — Encounter: Payer: Self-pay | Admitting: Emergency Medicine

## 2017-07-29 ENCOUNTER — Other Ambulatory Visit: Payer: Self-pay

## 2017-07-29 ENCOUNTER — Emergency Department: Payer: Medicaid Other

## 2017-07-29 DIAGNOSIS — F17210 Nicotine dependence, cigarettes, uncomplicated: Secondary | ICD-10-CM | POA: Diagnosis not present

## 2017-07-29 DIAGNOSIS — M25532 Pain in left wrist: Secondary | ICD-10-CM | POA: Insufficient documentation

## 2017-07-29 MED ORDER — LIDOCAINE 5 % EX PTCH
1.0000 | MEDICATED_PATCH | CUTANEOUS | Status: DC
  Administered 2017-07-29: 1 via TRANSDERMAL
  Filled 2017-07-29 (×2): qty 1

## 2017-07-29 MED ORDER — LIDOCAINE 5 % EX PTCH: 1.0000 | MEDICATED_PATCH | CUTANEOUS | 0 refills | Status: DC

## 2017-07-29 MED ORDER — KETOROLAC TROMETHAMINE 10 MG PO TABS: 10.0000 mg | ORAL_TABLET | Freq: Four times a day (QID) | ORAL | 0 refills | Status: DC | PRN

## 2017-07-29 MED ORDER — KETOROLAC TROMETHAMINE 10 MG PO TABS
10.0000 mg | ORAL_TABLET | Freq: Once | ORAL | Status: AC
  Administered 2017-07-29: 10 mg via ORAL
  Filled 2017-07-29: qty 1

## 2017-07-29 NOTE — ED Triage Notes (Addendum)
Patient ambulatory to triage with steady gait, without difficulty or distress noted; pt reports intermittent bilat wrist pain for last yr with no known inury

## 2017-07-29 NOTE — ED Notes (Signed)
Patient states pain is to anterior wrist, worse at left but present in both. 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 Denies any known injury. States "I've had this for years, but it's never hurt like this.". Patient states she has never been seen by MD for this problem or had xrays.

## 2017-07-29 NOTE — ED Provider Notes (Signed)
[facility] Emergency Department Provider Note __________________   First MD Initiated Contact with Patient 07/29/17 [phone]     (approximate)  I have reviewed the triage vital signs and the nursing notes.   HISTORY  Chief Complaint Wrist Pain   HPI Lavonna RuaLaneisan S Demetrius is a 37 y.o. female presents to the emergency department with a 1 year history of intermittent posterior left wrist pain.  Patient states pain is worsened over the past 3 days.  Patient states current pain score is 8 out of 10 and worse with any movement or palpation of the posterior left wrist.  Patient denies any fever.  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   Past Medical History:  Diagnosis Date  . Anemia   . Carpal tunnel syndrome   . Chlamydia   . Complication of anesthesia 11-2014   AFTER HERNIA SURGERY PT STATES SHE HAD TROUBLE SEEING, HER EYES WERE VERY SENSITIVE- THIS LASTED 12 HOURS AFTER HER SURGERY  . Gestational diabetes   . Heart murmur    ASYMPTOMATIC  . Plantar fasciitis   . Ventral hernia     Patient Active Problem List   Diagnosis Date Noted  . Abdominal pain 10/06/2015  . Gestational diabetes mellitus, antepartum 09/13/2014  . Non compliance with medical treatment 09/13/2014  . Indication for care in labor or delivery 09/13/2014    Past Surgical History:  Procedure Laterality Date  . BUNIONECTOMY Right 06/08/2015   Procedure: BUNIONECTOMY/ hallux valgus right foot;  Surgeon: Linus Galasodd Cline, DPM;  Location: [facility] OR[facility];  Service: Podiatry;  Laterality: Right;  . LAPAROSCOPIC OOPHERECTOMY Left 2016  . UMBILICAL HERNIA REPAIR N/A 12/14/2014   Procedure: HERNIA REPAIR UMBILICAL ADULT;  Surgeon: Earline MayotteJeffrey W Byrnett, MD;  Location: [facility] OR[facility];  Service: General;  Laterality: N/A;  . VENTRAL HERNIA REPAIR N/A 12/14/2014   Procedure: HERNIA REPAIR EPIGASTRIC  ADULT;  Surgeon: Earline MayotteJeffrey W Byrnett, MD;  Location: [facility] ORS;  Service: General;  Laterality: N/A;    Prior to  Admission medications   Medication Sig Start Date End Date Taking? Authorizing Provider  ibuprofen (ADVIL,MOTRIN) 600 MG tablet TAKE 1 TABLET BY MOUTH EVERY 6 HOURS AS NEEDED WITH FOOD 06/16/15   [provider]  ketorolac (TORADOL) 10 MG tablet Take 1 tablet (10 mg total) by mouth every 6 (six) hours as needed. 07/29/17   Darci CurrentBrown, Whittemore N, MD  lidocaine (LIDODERM) 5 % Place 1 patch onto the skin daily. Remove & Discard patch within 12 hours or as directed by MD 07/29/17   Darci CurrentBrown, Lamont N, MD    Allergies Penicillin g and [place] Binguna [fish allergy]  Family History  Problem Relation Age of Onset  . Diabetes Paternal Grandmother   . Cancer Father        lung    Social History Social History   Tobacco Use  . Smoking status: Current Every Day Smoker    Packs/day: 0.25    Years: 15.00    Pack years: 3.75    Types: Cigarettes    Last attempt to quit: 01/26/2014    Years since quitting: 3.5  . Smokeless tobacco: Never Used  Substance Use Topics  . Alcohol use: Yes    Alcohol/week: 0.0 oz    Comment: occasionally  . Drug use: Yes    Types: Marijuana    Comment: OCCASSIONALLY    Review of Systems Constitutional: No fever/chills Eyes: No visual changes. ENT: No sore throat. Cardiovascular: Denies chest pain. Respiratory: Denies  shortness of breath. Gastrointestinal: No abdominal pain.  No nausea, no vomiting.  No diarrhea.  No constipation. Genitourinary: Negative for dysuria. Musculoskeletal: Negative for neck pain.  Negative for back pain.  Positive for left wrist pain Integumentary: Negative for rash. Neurological: Negative for headaches, focal weakness or numbness.   ____________________________________________   PHYSICAL EXAM:  VITAL SIGNS: ED Triage Vitals  Enc Vitals Group     BP 07/29/17 0006 126/72     Pulse Rate 07/29/17 0006 77     Resp 07/29/17 0006 18     Temp 07/29/17 0006 98 F (36.7 C)     Temp Source 07/29/17 0006 Oral     SpO2 07/29/17 0006 100  %     Weight 07/29/17 0007 72.6 kg (160 lb)     Height 07/29/17 0007 1.727 m (5\' 8" )     Head Circumference --      Peak Flow --      Pain Score 07/29/17 0007 6     Pain Loc --      Pain Edu? --      Excl. in GC? --     Constitutional: Alert and oriented. Well appearing and in no acute distress. Eyes: Conjunctivae are normal.  Head: Atraumatic. Mouth/Throat: Mucous membranes are moist.  Oropharynx non-erythematous. Neck: No stridor.   Gastrointestinal: Soft and nontender. No distention.  Musculoskeletal: No lower extremity tenderness nor edema.  Point tenderness posterior left wrist.  Pain with active and passive range of motion in the same area. Neurologic:  Normal speech and language. No gross focal neurologic deficits are appreciated.  Skin:  Skin is warm, dry and intact. No rash noted.    ____________________________________________  RADIOLOGY I, Darci Current, personally viewed and evaluated these images (plain radiographs) as part of my medical decision making, as well as reviewing the written report by the radiologist.  ED MD interpretation: Negative left wrist x-ray.  Official radiology report(s): Dg Wrist Complete Left  Result Date: 07/29/2017 CLINICAL DATA:  Wrist pain EXAM: LEFT WRIST - COMPLETE 3+ VIEW COMPARISON:  None. FINDINGS: There is no evidence of fracture or dislocation. There is no evidence of arthropathy or other focal bone abnormality. Soft tissues are unremarkable. IMPRESSION: Negative. Electronically Signed   By: Jasmine Pang M.D.   On: 07/29/2017 02:29     Procedures   ____________________________________________   INITIAL IMPRESSION / ASSESSMENT AND PLAN / ED COURSE  As part of my medical decision making, I reviewed the following data within the electronic MEDICAL RECORD NUMBER   37 year old female presenting with above-stated history and physical exam of posterior left wrist pain.  Considered possibly of scapholunate dislocation given pain in  that area however no history of trauma.  Suspect bursitis versus tendinitis as etiology for the patient's pain.  X-ray was performed which was negative.  Lidoderm patch applied and patient given Toradol in the emergency department.  Patient will be referred to Dr. Stephenie Acres hand surgeon ____________________________________________  FINAL CLINICAL IMPRESSION(S) / ED DIAGNOSES  Final diagnoses:  Left wrist pain     MEDICATIONS GIVEN DURING THIS VISIT:  Medications  ketorolac (TORADOL) tablet 10 mg (10 mg Oral Given 07/29/17 0225)     ED Discharge Orders        Ordered    ketorolac (TORADOL) 10 MG tablet  Every 6 hours PRN     07/29/17 0234    lidocaine (LIDODERM) 5 %  Every 24 hours     07/29/17 0234  Note:  This document was prepared using Dragon voice recognition software and may include unintentional dictation errors.    Darci Current, MD 07/29/17 2259

## 2018-06-24 DIAGNOSIS — F319 Bipolar disorder, unspecified: Secondary | ICD-10-CM | POA: Insufficient documentation

## 2018-09-03 DIAGNOSIS — F319 Bipolar disorder, unspecified: Secondary | ICD-10-CM

## 2018-09-07 ENCOUNTER — Other Ambulatory Visit: Payer: Self-pay

## 2018-09-07 ENCOUNTER — Ambulatory Visit: Payer: Medicaid Other | Admitting: Nurse Practitioner

## 2018-09-07 DIAGNOSIS — N898 Other specified noninflammatory disorders of vagina: Secondary | ICD-10-CM | POA: Diagnosis not present

## 2018-09-07 DIAGNOSIS — Z113 Encounter for screening for infections with a predominantly sexual mode of transmission: Secondary | ICD-10-CM | POA: Diagnosis not present

## 2018-09-07 LAB — WET PREP FOR TRICH, YEAST, CLUE: Trichomonas Exam: NEGATIVE

## 2018-09-07 MED ORDER — CLOTRIMAZOLE 1 % VA CREA
1.0000 | TOPICAL_CREAM | Freq: Every day | VAGINAL | 0 refills | Status: AC
Start: 2018-09-07 — End: 2018-09-14

## 2018-09-07 NOTE — Progress Notes (Signed)
    STI clinic/screening visit  Subjective:  Doris West is a 38 y.o. female being seen today for an STI screening visit. The patient reports they do have symptoms.  Patient has the following medical conditions:   Patient Active Problem List   Diagnosis Date Noted  . Bipolar 1 disorder ([place]) 06/24/2018  . Abdominal pain 10/06/2015  . Non compliance with medical treatment 09/13/2014     Chief Complaint  Patient presents with  . SEXUALLY TRANSMITTED DISEASE    c/o vaginal itching - declines blood wrok    Client here c/o vaginal itching  Any questions or concerns before we get started -   Client asked the following questions for STD screening:  Allergies: PCN, and Tuna Previous Surgeries: denies [place] - bipolar Medications  - denies Contact to STD - denies Antibiotics in last 2 wks - no Last HIV test - unsure Prior STD - denies Immunizations UTD - yes Last sex  - X last 2 wks  Partners Last 2 months - 1 Type of sex - vagnal IVD - denies MJ use - denies Travel in last 3 months  - no Abuse hx - denies LMP  Last pap  Douche  G  P   Ab  Sab BCM     Patient reports  - vaginal itching  See flowsheet for further details and programmatic requirements.    The following portions of the patient's history were reviewed and updated as appropriate: allergies, current medications, past medical history, past social history, past surgical history and problem list.  Objective:  There were no vitals filed for this visit.  Physical Exam Genitourinary:    General: Normal vulva.     Exam position: Lithotomy position.     Vagina: Normal.     Cervix: Discharge (small amt brown discharge - >4.5 ph, no foul odor noted ) present. No cervical motion tenderness.       Assessment and Plan:  Doris West is a 38 y.o. female presenting to the [facility] Department for STI screening  1. Screening examination for STD (sexually transmitted disease) Await results -  Chlamydia/Gonorrhea [place] [facility]  2. Vaginal itching  - WET PREP FOR TRICH, YEAST, CLUE - provider to review wet mount results       Return if symptoms worsen or fail to improve, for 11-13 weeks for DMPA.  No future appointments.  Berniece Andreas, NP    Any questions or concerns before we get started -   Client asked the following questions for STD screening:  Allergies: PCN (unknown reaction) Previous Surgeries:bunionectomy 01/2015, hernia surgery 11/2014, Tetanus 2016 [place]  Medications none Contact to STD  Antibiotics in last 2 wks  Last HIV test  Prior STD Immunizations UTD  Last sex  09/05/2018 with condom X last 2 wks  Partners Last 2 months 2 Type of sex  Smoke yes 2cpd ETOH  IVD  MJ use  Travel in last 3 months  Abuse hx  LMP spotting Last pap  Douche  G5 P 2  Ab  Sab BCM DMPA 07/2018

## 2018-09-07 NOTE — Progress Notes (Signed)
In due to vaginal itching x >1 wk. Debera Lat, RN +Yeast-treated per standing order Debera Lat, RN

## 2018-09-12 ENCOUNTER — Encounter: Payer: Self-pay | Admitting: Nurse Practitioner

## 2018-10-24 IMAGING — DX DG WRIST COMPLETE 3+V*L*
4 series · 4 of 4 positions shown · non-contrast
Comparison: None.

CLINICAL DATA: Wrist pain

EXAM:
LEFT WRIST - COMPLETE 3+ VIEW

[wrist ap (1 of 2)]
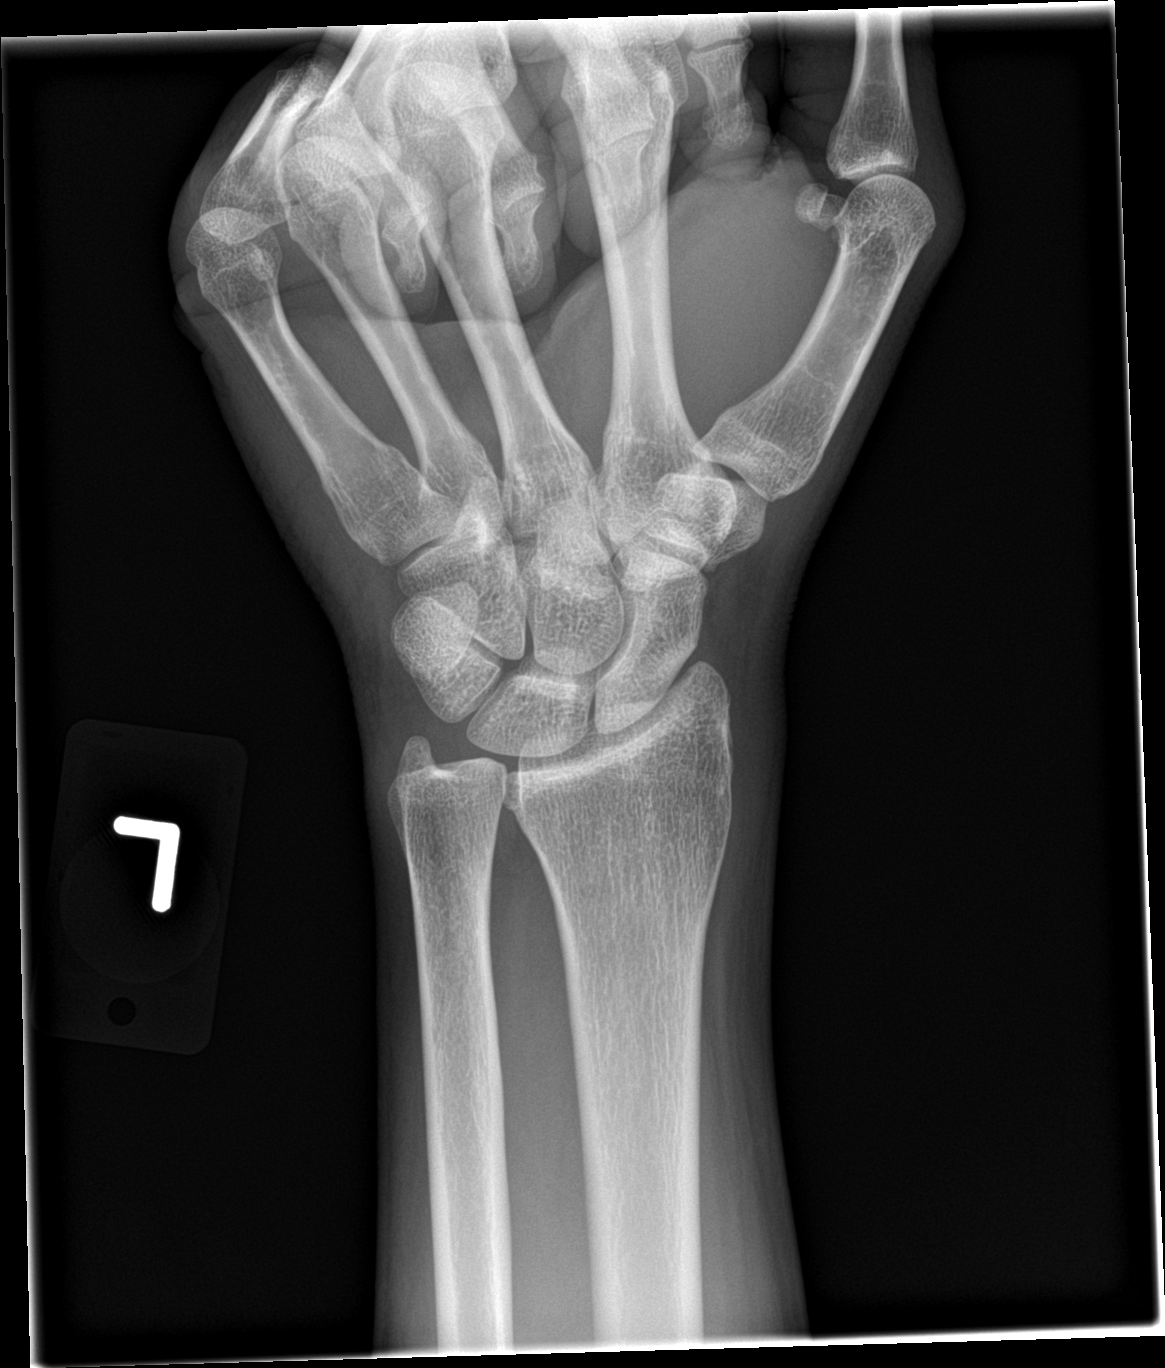

[wrist obl]
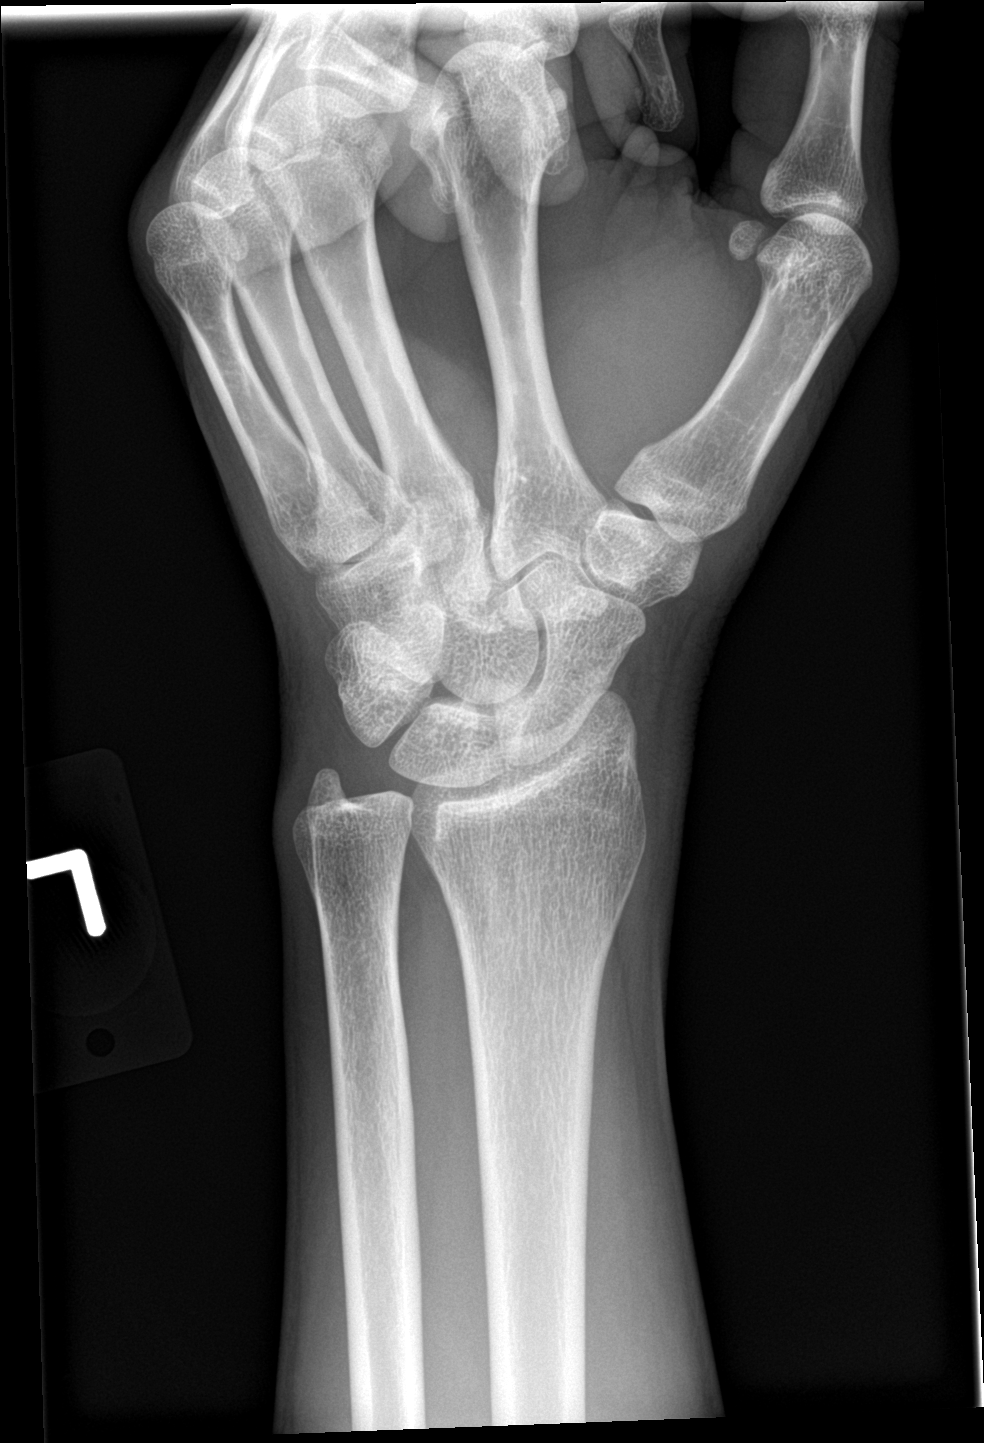

[wrist lat]
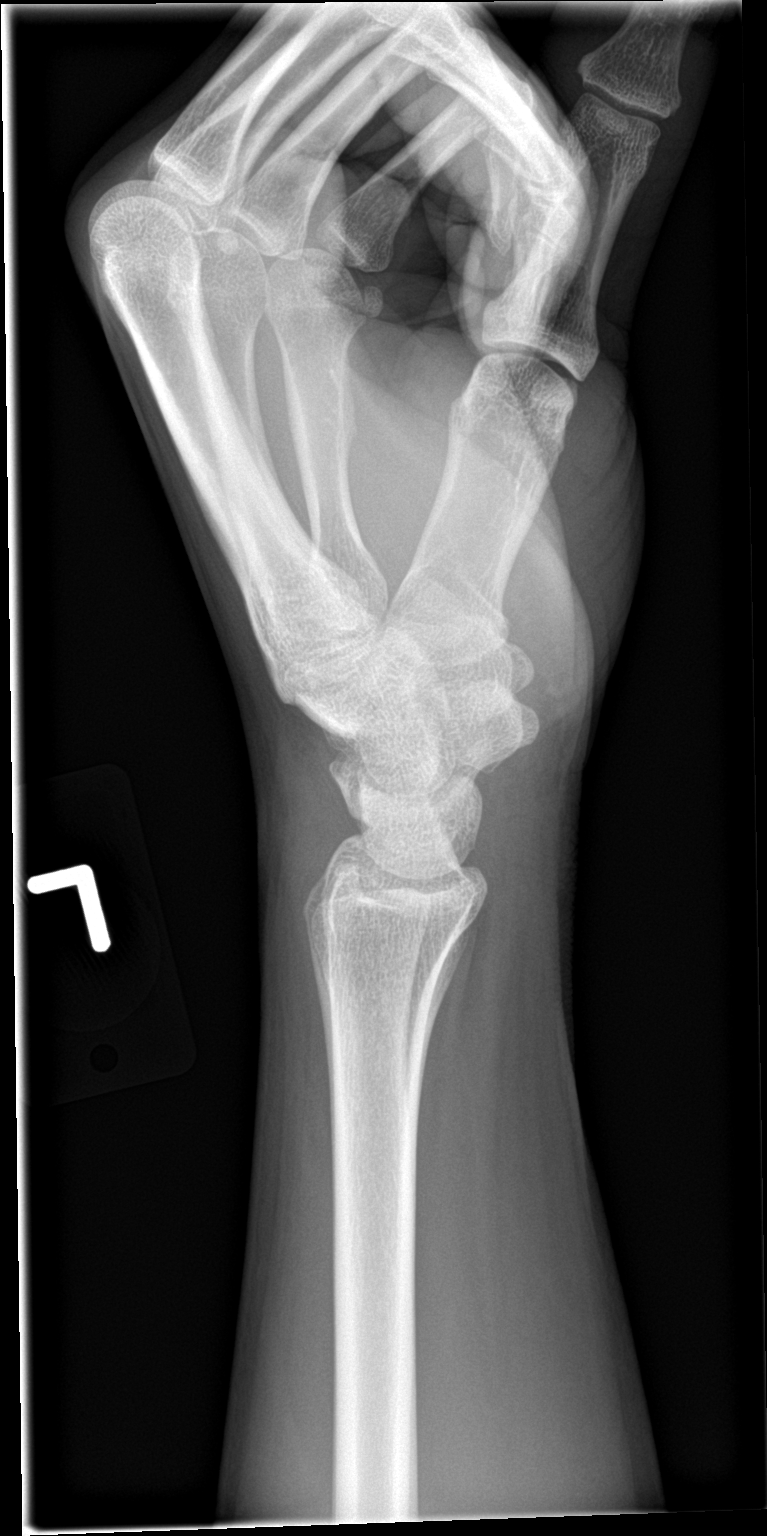

[wrist ap (2 of 2)]
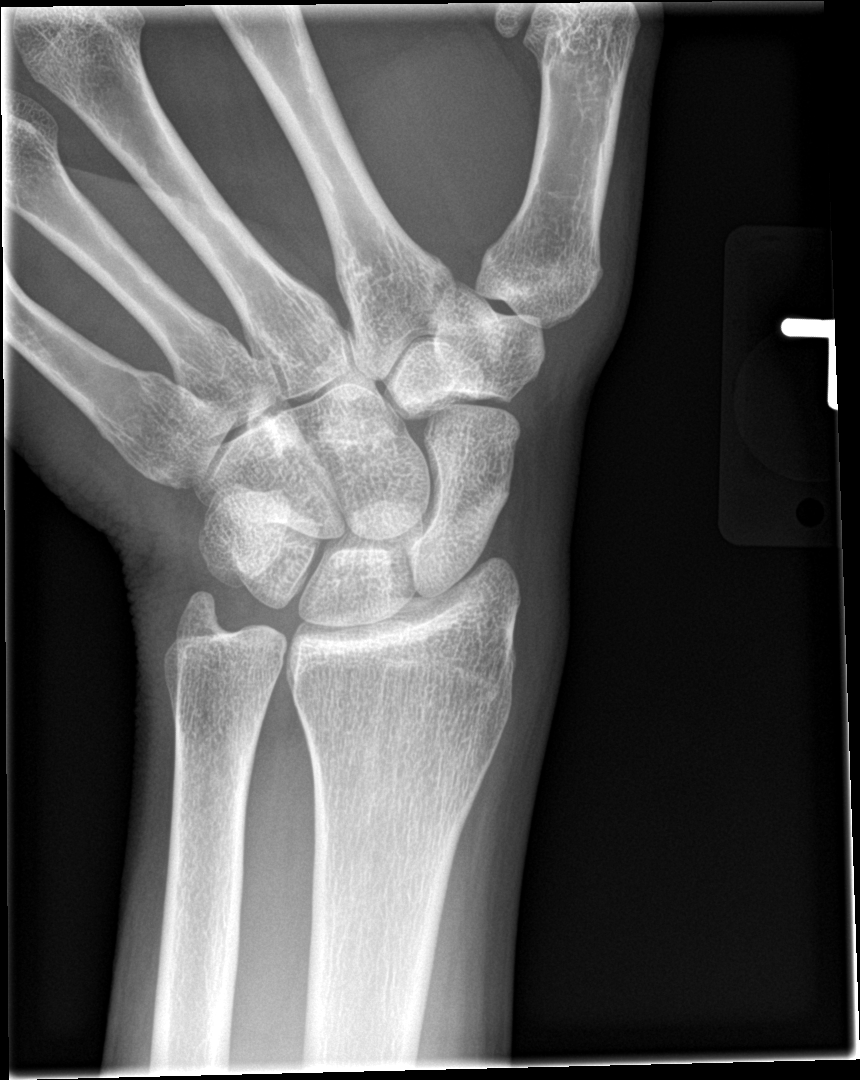

[4 of 4 positions shown; findings below may reference images not displayed]

FINDINGS: There is no evidence of fracture or dislocation. There is no
evidence of arthropathy or other focal bone abnormality. Soft
tissues are unremarkable.
IMPRESSION: Negative.

## 2018-11-29 ENCOUNTER — Ambulatory Visit: Payer: Medicaid Other | Admitting: Advanced Practice Midwife

## 2018-11-29 ENCOUNTER — Other Ambulatory Visit: Payer: Self-pay

## 2018-11-29 ENCOUNTER — Encounter: Payer: Self-pay | Admitting: Advanced Practice Midwife

## 2018-11-29 DIAGNOSIS — Z113 Encounter for screening for infections with a predominantly sexual mode of transmission: Secondary | ICD-10-CM

## 2018-11-29 DIAGNOSIS — F1210 Cannabis abuse, uncomplicated: Secondary | ICD-10-CM | POA: Insufficient documentation

## 2018-11-29 LAB — WET PREP FOR TRICH, YEAST, CLUE
Trichomonas Exam: NEGATIVE
Yeast Exam: NEGATIVE

## 2018-11-29 NOTE — Progress Notes (Addendum)
Here today for STD screening. Declines bloodwork. Hal Morales, RN Wet Prep results reviewed. Per standing orders no treatment indicated. Hal Morales, RN

## 2018-11-29 NOTE — Progress Notes (Signed)
    STI clinic/screening visit  Subjective:  Doris West is a 38 y.[id number] smoker MJ daily female being seen today for an STI screening visit. The patient reports they do have symptoms.  Patient has the following medical conditions:   Patient Active Problem List   Diagnosis Date Noted  . Bipolar 1 disorder ([place]) 06/24/2018     Chief Complaint  Patient presents with  . SEXUALLY TRANSMITTED DISEASE    HPI  Patient reports increased vaginal d/c past couple days and doesn't know what color  See flowsheet for further details and programmatic requirements.    The following portions of the patient's history were reviewed and updated as appropriate: allergies, current medications, past medical history, past social history, past surgical history and problem list.  Objective:  There were no vitals filed for this visit.  Physical Exam Vitals signs and nursing note reviewed.  Constitutional:      Appearance: Normal appearance.  HENT:     Head: Normocephalic and atraumatic.     Mouth/Throat:     Mouth: Mucous membranes are moist.     Pharynx: Oropharynx is clear. No oropharyngeal exudate or posterior oropharyngeal erythema.  Eyes:     Conjunctiva/sclera: Conjunctivae normal.  Neck:     Musculoskeletal: Normal range of motion and neck supple.  Pulmonary:     Effort: Pulmonary effort is normal.  Abdominal:     General: Abdomen is flat.     Palpations: Abdomen is soft. There is no mass.     Tenderness: There is no abdominal tenderness. There is no rebound.     Comments: Soft without tenderness  Genitourinary:    General: Normal vulva.     Exam position: Lithotomy position.     Pubic Area: No rash or pubic lice.      Labia:        Right: No rash or lesion.        Left: No rash or lesion.      Vagina: Vaginal discharge (small amt white leukorrhea, ph <4.5) present. No erythema, bleeding or lesions.     Cervix: No cervical motion tenderness, discharge, friability, lesion or  erythema.     Uterus: Normal.      Adnexa: Right adnexa normal and left adnexa normal.     Rectum: Normal.  Lymphadenopathy:     Head:     Right side of head: No preauricular or posterior auricular adenopathy.     Left side of head: No preauricular or posterior auricular adenopathy.     Cervical: No cervical adenopathy.     Upper Body:     Right upper body: No supraclavicular or axillary adenopathy.     Left upper body: No supraclavicular or axillary adenopathy.     Lower Body: No right inguinal adenopathy. No left inguinal adenopathy.  Skin:    General: Skin is warm and dry.     Findings: No rash.  Neurological:     Mental Status: She is alert and oriented to person, place, and time.       Assessment and Plan:  Doris West is a 38 y.o. female presenting to the [facility] Department for STI screening  1. Screening examination for venereal disease Treat wet mount per standing orders Immunization nurse consult - WET PREP FOR [place], YEAST, [place] culture     Return for 11-13 wk DMPA.  No future appointments.  Herbie Saxon, CNM

## 2018-12-04 LAB — GONOCOCCUS CULTURE

## 2019-01-11 ENCOUNTER — Encounter: Payer: Self-pay | Admitting: Family Medicine

## 2019-01-11 ENCOUNTER — Other Ambulatory Visit: Payer: Self-pay

## 2019-01-11 ENCOUNTER — Ambulatory Visit: Payer: Medicaid Other | Admitting: Family Medicine

## 2019-01-11 DIAGNOSIS — Z113 Encounter for screening for infections with a predominantly sexual mode of transmission: Secondary | ICD-10-CM

## 2019-01-11 DIAGNOSIS — B379 Candidiasis, unspecified: Secondary | ICD-10-CM

## 2019-01-11 DIAGNOSIS — Z720 Tobacco use: Secondary | ICD-10-CM

## 2019-01-11 LAB — WET PREP FOR TRICH, YEAST, CLUE: Trichomonas Exam: NEGATIVE

## 2019-01-11 MED ORDER — CLOTRIMAZOLE 1 % VA CREA: 1.0000 | TOPICAL_CREAM | Freq: Every day | VAGINAL | 0 refills | Status: DC

## 2019-01-11 MED ORDER — CLOTRIMAZOLE-BETAMETHASONE 1-0.05 % EX CREA: 1.0000 "application " | TOPICAL_CREAM | Freq: Two times a day (BID) | CUTANEOUS | 0 refills | Status: DC

## 2019-01-11 NOTE — Progress Notes (Signed)
[facility] Department STI clinic/screening visit  Subjective:  Doris West is a 37 y.o. female being seen today for  Chief Complaint  Patient presents with  . SEXUALLY TRANSMITTED DISEASE  .    Patient has the following medical conditions:   Patient Active Problem List   Diagnosis Date Noted  . Marijuana abuse daily 11/29/2018  . Bipolar 1 disorder ([place]) 06/24/2018    HPI Here for STI testing. The patient reports they do have symptoms. Having white discharge and itching. Has hx of BV.     See flowsheet for further details and programmatic requirements.   No LMP recorded. Patient has had an injection. Last sex: today BCM: depo ECP: n/a  Patient reports that they do not desire a pregnancy in the next year. They reported they are not interested in discussing contraception today.   No components found for: HCV]   The following portions of the patient's history were reviewed and updated as appropriate: allergies, current medications, past medical history, past social history, past surgical history and problem list.  Objective:  There were no vitals filed for this visit.   Physical Exam Vitals and nursing note reviewed.  Constitutional:      Appearance: Normal appearance.  HENT:     Head: Normocephalic and atraumatic.     Mouth/Throat:     Mouth: Mucous membranes are moist.     Pharynx: Oropharynx is clear. No oropharyngeal exudate or posterior oropharyngeal erythema.  Pulmonary:     Effort: Pulmonary effort is normal.  Abdominal:     General: Abdomen is flat.     Palpations: There is no mass.     Tenderness: There is no abdominal tenderness. There is no rebound.  Genitourinary:    General: Normal vulva.     Exam position: Lithotomy position.     Pubic Area: No rash or pubic lice.      Labia:        Right: No rash or lesion.        Left: No rash or lesion.      Vagina: Vaginal discharge (white, clumpy, ph<4.5) present. No erythema, bleeding or  lesions.     Cervix: No cervical motion tenderness, discharge, friability, lesion or erythema.     Uterus: Normal.      Adnexa: Right adnexa normal and left adnexa normal.     Rectum: Normal.     Comments: Mild erythema bilateral labia. Lymphadenopathy:     Head:     Right side of head: No preauricular or posterior auricular adenopathy.     Left side of head: No preauricular or posterior auricular adenopathy.     Cervical: No cervical adenopathy.     Upper Body:     Right upper body: No supraclavicular or axillary adenopathy.     Left upper body: No supraclavicular or axillary adenopathy.     Lower Body: No right inguinal adenopathy. No left inguinal adenopathy.  Skin:    General: Skin is warm and dry.     Findings: No rash.  Neurological:     Mental Status: She is alert and oriented to person, place, and time.      Assessment and Plan:  Doris West is a 38 y.o. female presenting to the [facility] Department for STI screening    1. Screening examination for venereal disease -Screenings today as below. Treat wet per standing order -Patient does meet criteria for HepB, HepC Screening. Declines these screenings. Also declines HIV,  syphilis. -Counseled on warning s/sx and when to seek care. Recommended condom use with all sex. - Chlamydia/Gonorrhea [place] [facility] - WET PREP FOR TRICH, YEAST, CLUE  2. Yeast infection -Treat for yeast infection d/t clumpy white discharge, mild erythema of labia and reports of itching. - clotrimazole-betamethasone (LOTRISONE) cream; Apply 1 application topically 2 (two) times daily.  Dispense: 30 g; Refill: 0  2. Tobacco use -Encouraged pt in their desire to quit, counseled using 5 A's. Tobacco Quitline info given and offered referral for behavioral health which pt declines.   Return for screening as needed.  No future appointments.  Ann Held, PA-C

## 2019-01-11 NOTE — Progress Notes (Signed)
Wet mount reviewed and with moderate yeast. Treated per standing order per Charlotte Sanes PA-C order. Rich Number, RN

## 2019-03-04 ENCOUNTER — Ambulatory Visit: Payer: Medicaid Other

## 2019-06-06 ENCOUNTER — Ambulatory Visit: Payer: Medicaid Other | Admitting: Advanced Practice Midwife

## 2019-06-06 ENCOUNTER — Other Ambulatory Visit: Payer: Self-pay

## 2019-06-06 ENCOUNTER — Encounter: Payer: Self-pay | Admitting: Advanced Practice Midwife

## 2019-06-06 DIAGNOSIS — Z113 Encounter for screening for infections with a predominantly sexual mode of transmission: Secondary | ICD-10-CM | POA: Diagnosis not present

## 2019-06-06 DIAGNOSIS — F17200 Nicotine dependence, unspecified, uncomplicated: Secondary | ICD-10-CM

## 2019-06-06 DIAGNOSIS — F419 Anxiety disorder, unspecified: Secondary | ICD-10-CM | POA: Insufficient documentation

## 2019-06-06 LAB — WET PREP FOR TRICH, YEAST, CLUE
Trichomonas Exam: NEGATIVE
Yeast Exam: NEGATIVE

## 2019-06-06 NOTE — Progress Notes (Signed)
  [facility] Department STI clinic/screening visit  Subjective:  Doris West is a 39 y.o.G5P2  female smoker being seen today for an STI screening visit. The patient reports they do not have symptoms.  Patient reports that they do not desire a pregnancy in the next year.   They reported they are not interested in discussing contraception today.  No LMP recorded. Patient has had an injection.   Patient has the following medical conditions:   Patient Active Problem List   Diagnosis Date Noted  . Marijuana abuse daily 11/29/2018  . Bipolar 1 disorder (HCC) 06/24/2018    Chief Complaint  Patient presents with  . SEXUALLY TRANSMITTED DISEASE    HPI  Patient reports last sex 06/01/19 without condom.  LMP not with DMPA.  Receives DMPA from [facility]ine[facility] Real last time 02/2019.    See flowsheet for further details and programmatic requirements.    The following portions of the patient's history were reviewed and updated as appropriate: allergies, current medications, past medical history, past social history, past surgical history and problem list.  Objective:  There were no vitals filed for this visit.  Physical Exam Vitals and nursing note reviewed.  Constitutional:      Appearance: Normal appearance.  HENT:     Head: Normocephalic and atraumatic.     Mouth/Throat:     Mouth: Mucous membranes are moist.     Pharynx: Oropharynx is clear. No oropharyngeal exudate or posterior oropharyngeal erythema.  Pulmonary:     Effort: Pulmonary effort is normal.  Abdominal:     General: Abdomen is flat.     Palpations: Abdomen is soft. There is no mass.     Tenderness: There is no abdominal tenderness. There is no rebound.  Genitourinary:    General: Normal vulva.     Exam position: Lithotomy position.     Pubic Area: No rash or pubic lice.      Labia:        Right: No rash or lesion.        Left: No rash or lesion.      Vagina: Vaginal discharge (milky light brown malodorous  leukorrhea, ph<4.5) present. No erythema, bleeding or lesions.     Cervix: Normal.     Uterus: Normal.      Adnexa: Right adnexa normal and left adnexa normal.     Rectum: Normal.  Lymphadenopathy:     Head:     Right side of head: No preauricular or posterior auricular adenopathy.     Left side of head: No preauricular or posterior auricular adenopathy.     Cervical: No cervical adenopathy.     Upper Body:     Right upper body: No supraclavicular or axillary adenopathy.     Left upper body: No supraclavicular or axillary adenopathy.     Lower Body: No right inguinal adenopathy. No left inguinal adenopathy.  Skin:    General: Skin is warm and dry.     Findings: No rash.  Neurological:     Mental Status: She is alert and oriented to person, place, and time.      Assessment and Plan:  Doris West is a 39 y.o. female presenting to the [facility] Department for STI screening  1. Screening examination for venereal disease Treat wet mount per standing orders Immunization nurse consult     Return if symptoms worsen or fail to improve.  No future appointments.  Alberteen Spindle, CNM

## 2019-06-06 NOTE — Progress Notes (Signed)
Here today for STD screening. Accepts bloodwork. Rejina Odle, RN ° °

## 2019-06-06 NOTE — Progress Notes (Signed)
Wet mount reviewed, no tx per standing order. Provider orders completed. 

## 2019-06-10 LAB — HEPATITIS B SURFACE ANTIGEN

## 2019-06-13 LAB — HM HIV SCREENING LAB: HM HIV Screening: NEGATIVE

## 2019-06-13 LAB — HM HEPATITIS C SCREENING LAB: HM Hepatitis Screen: NEGATIVE

## 2019-08-03 ENCOUNTER — Ambulatory Visit: Payer: Medicaid Other | Admitting: Family Medicine

## 2019-08-03 ENCOUNTER — Encounter: Payer: Self-pay | Admitting: Family Medicine

## 2019-08-03 ENCOUNTER — Other Ambulatory Visit: Payer: Self-pay

## 2019-08-03 DIAGNOSIS — N760 Acute vaginitis: Secondary | ICD-10-CM

## 2019-08-03 DIAGNOSIS — Z113 Encounter for screening for infections with a predominantly sexual mode of transmission: Secondary | ICD-10-CM | POA: Diagnosis not present

## 2019-08-03 LAB — WET PREP FOR TRICH, YEAST, CLUE
Trichomonas Exam: NEGATIVE
Yeast Exam: NEGATIVE

## 2019-08-03 MED ORDER — METRONIDAZOLE 0.75 % VA GEL: 1.0000 | Freq: Every day | VAGINAL | Status: DC

## 2019-08-03 MED ORDER — METRONIDAZOLE 0.75 % VA GEL: 1.0000 | Freq: Every day | VAGINAL | 0 refills | Status: AC

## 2019-08-03 NOTE — Progress Notes (Deleted)
[facility] Department STI clinic/screening visit  Subjective:  Doris West is a 39 y.o. female being seen today for an STI screening visit. The patient reports they {Actions; do/do not:19616} have symptoms.  Patient reports that they {Actions; do/do not:19616} desire a pregnancy in the next year.   They reported they {Actions; are/are not:16769} interested in discussing contraception today.  No LMP recorded (approximate). Patient has had an injection.   Patient has the following medical conditions:   Patient Active Problem List   Diagnosis Date Noted   Smoker 1/2-1 ppd 06/06/2019   Anxiety 06/06/2019   Marijuana abuse daily since age 36 11/29/2018   Bipolar 1 disorder (HCC) 06/24/2018    No chief complaint on file.   HPI  Patient reports ***  Last HIV test per patient/review of record was *** Patient reports last pap was ***.   See flowsheet for further details and programmatic requirements.    The following portions of the patient's history were reviewed and updated as appropriate: allergies, current medications, past medical history, past social history, past surgical history and problem list.  Objective:  There were no vitals filed for this visit.  Physical Exam Vitals and nursing note reviewed.  Constitutional:      Appearance: Normal appearance.  HENT:     Head: Normocephalic and atraumatic.     Mouth/Throat:     Mouth: Mucous membranes are moist.     Pharynx: Oropharynx is clear. No oropharyngeal exudate or posterior oropharyngeal erythema.  Pulmonary:     Effort: Pulmonary effort is normal.  Abdominal:     General: Abdomen is flat.     Palpations: There is no mass.     Tenderness: There is no abdominal tenderness. There is no rebound.  Genitourinary:    General: Normal vulva.     Exam position: Lithotomy position.     Pubic Area: No rash or pubic lice.      Labia:        Right: No rash or lesion.        Left: No rash or lesion.       Vagina: Vaginal discharge present. No erythema, bleeding or lesions.     Cervix: No cervical motion tenderness, discharge, friability, lesion or erythema.     Uterus: Normal.      Adnexa: Right adnexa normal and left adnexa normal.     Rectum: Normal.     Comments: Thick, yellow disch , +fishy odor, pH>4.5 Lymphadenopathy:     Head:     Right side of head: No preauricular or posterior auricular adenopathy.     Left side of head: No preauricular or posterior auricular adenopathy.     Cervical: No cervical adenopathy.     Upper Body:     Right upper body: No supraclavicular or axillary adenopathy.     Left upper body: No supraclavicular or axillary adenopathy.     Lower Body: No right inguinal adenopathy. No left inguinal adenopathy.  Skin:    General: Skin is warm and dry.     Findings: No rash.  Neurological:     Mental Status: She is alert and oriented to person, place, and time.      Assessment and Plan:  Doris West is a 39 y.o. female presenting to the [facility] Department for STI screening  1. Screening examination for venereal disease Treat for BV if + for clues/amine  - WET PREP FOR TRICH, YEAST, CLUE - Chlamydia/Gonorrhea [place]  Lab - HIV/HCV [place] [facility] - Hepatitis Serology, [place] [facility] - Syphilis Serology, [place] [facility]     No follow-ups on file.  No future appointments.  Larene Pickett, FNP

## 2019-08-03 NOTE — Progress Notes (Signed)
Wet mount reviewed, patient treated for BV per provider orders. Patient states she cannot take metronidazole tablets because they stick in her throat and the taste makes her feel sick. Per provider orders, patient given metro gel and counseled how to use.Burt Knack, RN

## 2019-08-03 NOTE — Progress Notes (Signed)
[facility] Department STI clinic/screening visit  Subjective:  Doris West is a 39 y.o. female being seen today for an STI screening visit. The patient reports they do have symptoms.  Patient reports that they do not desire a pregnancy in the next year.   They reported they are not interested in discussing contraception today.  No LMP recorded (approximate). Patient has had an injection.   Patient has the following medical conditions:   Patient Active Problem List   Diagnosis Date Noted   Smoker 1/2-1 ppd 06/06/2019   Anxiety 06/06/2019   Marijuana abuse daily since age 31 11/29/2018   Bipolar 1 disorder (HCC) 06/24/2018    Chief Complaint  Patient presents with   Exposure to STD    HPI  Patient reports that she has had a strong, foul odor since Wednesday after having sex with ex-BF.  She states that he has other partners and whenever they're sexually active she usually hav a infection afterwards.  Last HIV test per patient/review of record was 2021 Patient reports last pap was 2021?Marland Kitchen   See flowsheet for further details and programmatic requirements.    The following portions of the patient's history were reviewed and updated as appropriate: allergies, current medications, past medical history, past social history, past surgical history and problem list.  Objective:  There were no vitals filed for this visit.  Physical Exam Vitals and nursing note reviewed.  Constitutional:      Appearance: Normal appearance.  HENT:     Head: Normocephalic and atraumatic.     Mouth/Throat:     Mouth: Mucous membranes are moist.     Pharynx: Oropharynx is clear. No oropharyngeal exudate or posterior oropharyngeal erythema.  Pulmonary:     Effort: Pulmonary effort is normal.  Abdominal:     General: Abdomen is flat.     Palpations: There is no mass.     Tenderness: There is no abdominal tenderness. There is no rebound.  Genitourinary:    General: Normal vulva.      Exam position: Lithotomy position.     Pubic Area: No rash or pubic lice.      Labia:        Right: No rash or lesion.        Left: No rash or lesion.      Vagina: Vaginal discharge present. No erythema, bleeding or lesions.     Cervix: No cervical motion tenderness, discharge, friability, lesion or erythema.     Uterus: Normal.      Adnexa: Right adnexa normal and left adnexa normal.     Rectum: Normal.     Comments: Thick, yellow disch , +fishy odor, pH>4.5 Lymphadenopathy:     Head:     Right side of head: No preauricular or posterior auricular adenopathy.     Left side of head: No preauricular or posterior auricular adenopathy.     Cervical: No cervical adenopathy.     Upper Body:     Right upper body: No supraclavicular or axillary adenopathy.     Left upper body: No supraclavicular or axillary adenopathy.     Lower Body: No right inguinal adenopathy. No left inguinal adenopathy.  Skin:    General: Skin is warm and dry.     Findings: No rash.  Neurological:     Mental Status: She is alert and oriented to person, place, and time.      Assessment and Plan:  Doris West is a 39 y.o. female  presenting to the [facility] Department for STI screening  1. Screening examination for venereal disease  - WET PREP FOR TRICH, YEAST, CLUE - Chlamydia/Gonorrhea [place] Lab - HIV/HCV [place] [facility] - Hepatitis Serology, [place] [facility] - Syphilis Serology, [place] [facility]   2. BV (bacterial vaginosis) Client states that she doesn't tolerate the po Metronidazole requests the gel - metroNIDAZOLE (METROGEL VAGINAL) 0.75 % vaginal gel; Place 1 Applicatorful vaginally at bedtime for 5 days.  Dispense: 70 g; Refill: 0 Co. No sexual activity x 1 week' Condoms for STD prevention   No follow-ups on file.  No future appointments.  Doris Pickett, FNP

## 2019-08-15 ENCOUNTER — Ambulatory Visit: Payer: Medicaid Other | Admitting: Advanced Practice Midwife

## 2019-08-15 ENCOUNTER — Telehealth: Payer: Self-pay | Admitting: Family Medicine

## 2019-08-15 ENCOUNTER — Other Ambulatory Visit: Payer: Self-pay

## 2019-08-15 DIAGNOSIS — Z113 Encounter for screening for infections with a predominantly sexual mode of transmission: Secondary | ICD-10-CM

## 2019-08-15 DIAGNOSIS — N760 Acute vaginitis: Secondary | ICD-10-CM

## 2019-08-15 MED ORDER — METRONIDAZOLE 500 MG PO TABS: 500.0000 mg | ORAL_TABLET | Freq: Two times a day (BID) | ORAL | 0 refills | Status: AC

## 2019-08-15 NOTE — Telephone Encounter (Signed)
Pt to provider clinic 08/15/19 for evaluation.

## 2019-08-15 NOTE — Progress Notes (Signed)
  [facility] Department STI clinic/screening visit  Subjective:  Doris West is a 39 y.o. female being seen today for an STI screening visit. The patient reports they do have symptoms.  Patient reports that they do not desire a pregnancy in the next year.   They reported they are not interested in discussing contraception today.  No LMP recorded (approximate). Patient has had an injection.   Patient has the following medical conditions:   Patient Active Problem List   Diagnosis Date Noted  . Smoker 1/2-1 ppd 06/06/2019  . Anxiety 06/06/2019  . Marijuana abuse daily since age 3 11/29/2018  . Bipolar 1 disorder (HCC) 06/24/2018    No chief complaint on file.   HPI  Patient reports came here 08/03/19 and dx'd with BV and given Metrogel--used last tube last night.  Still having sxs and now refusing exam and wants Metronidazole po.  Last HIV test per patient/review of record was 08/03/19 Patient reports last pap was unknown  See flowsheet for further details and programmatic requirements.    The following portions of the patient's history were reviewed and updated as appropriate: allergies, current medications, past medical history, past social history, past surgical history and problem list.  Objective:  There were no vitals filed for this visit.  Physical Exam  n/a Assessment and Plan:  Doris West is a 39 y.o. female presenting to the [facility] Department for STI screening  1. Screening examination for venereal disease Treat BV from 08/03/19 with Metronidazole po per standing orders and pt request.  Pt refusing exam and history     Return if symptoms worsen or fail to improve.  No future appointments.  Alberteen Spindle, CNM

## 2019-08-15 NOTE — Telephone Encounter (Signed)
meds is not helping

## 2019-08-15 NOTE — Progress Notes (Signed)
Pt received Metronidazole 500mg  #14, 1 tab po BID x7 days per , CNM verbal order. Provider orders completed.

## 2019-08-15 NOTE — Telephone Encounter (Signed)
Returned call to pt at phone # provided. Pt reports that she was seen 08/03/2019 and was given Metro gel for BV and that she finished her Metro gel a few days ago. Pt reports she is still having symptoms and feels like she is having more vaginal irritation, reports having spotting, feeling achy in her abdomen and still having an odor. Pt denies any further symptoms. Counseled pt that I would speak with a provider and give her a call back. Consulted with Sadie Haber, PA regarding conversation with pt and per Sadie Haber, PA sometimes using Metro gel can cause yeast so pt could try an OTC yeast cream or could RTC to be evaluated. Returned call to pt and counseled pt per Sadie Haber, PA and pt states understanding and that she would like to RTC to be seen. Pt scheduled for STI appt for today 08/15/2019 at 1:00pm per pt request. Pt aware of appt date and time and to arrive early for check-in. Pt with no further questions or concerns at this time.

## 2019-08-15 NOTE — Telephone Encounter (Signed)
Consulted by RN re:  patient concerns.  Reviewed RN note and agree that it reflects discussion and recommendations. 

## 2019-09-08 ENCOUNTER — Other Ambulatory Visit: Payer: Self-pay

## 2019-09-08 ENCOUNTER — Ambulatory Visit: Payer: Medicaid Other | Admitting: Physician Assistant

## 2019-09-08 DIAGNOSIS — B3731 Acute candidiasis of vulva and vagina: Secondary | ICD-10-CM

## 2019-09-08 DIAGNOSIS — Z113 Encounter for screening for infections with a predominantly sexual mode of transmission: Secondary | ICD-10-CM | POA: Diagnosis not present

## 2019-09-08 DIAGNOSIS — B373 Candidiasis of vulva and vagina: Secondary | ICD-10-CM | POA: Diagnosis not present

## 2019-09-08 LAB — WET PREP FOR TRICH, YEAST, CLUE
Trichomonas Exam: NEGATIVE
Yeast Exam: NEGATIVE

## 2019-09-08 MED ORDER — FLUCONAZOLE 150 MG PO TABS: 150.0000 mg | ORAL_TABLET | Freq: Once | ORAL | 0 refills | Status: AC

## 2019-09-10 ENCOUNTER — Encounter: Payer: Self-pay | Admitting: Physician Assistant

## 2019-09-10 NOTE — Progress Notes (Signed)
[facility] Department STI clinic/screening visit  Subjective:  Doris West is a 39 y.o. female being seen today for an STI screening visit. The patient reports they do have symptoms.  Patient reports that they do not desire a pregnancy in the next year.   They reported they are not interested in discussing contraception today.  No LMP recorded. Patient has had an injection.   Patient has the following medical conditions:   Patient Active Problem List   Diagnosis Date Noted  . Smoker 1/2-1 ppd 06/06/2019  . Anxiety 06/06/2019  . Marijuana abuse daily since age 9 11/29/2018  . Bipolar 1 disorder (HCC) 06/24/2018    Chief Complaint  Patient presents with  . SEXUALLY TRANSMITTED DISEASE    screening    HPI  Patient reports that she has had a thin, clear discharge and itching for 2 days.  States that she thinks she may have a yeast infection.  Reviewed patient visits from 08/03/2019 and 08/15/2019, and patient denies any changes except the symptoms she is experiencing today.  Last HIV test was 2021 and last pap during this year as well.   See flowsheet for further details and programmatic requirements.    The following portions of the patient's history were reviewed and updated as appropriate: allergies, current medications, past medical history, past social history, past surgical history and problem list.  Objective:  There were no vitals filed for this visit.  Physical Exam Constitutional:      General: She is not in acute distress.    Appearance: Normal appearance.  HENT:     Head: Normocephalic and atraumatic.     Comments: No nits, lice or hair loss. No cervical, supraclavicular or axillary adenopathy.    Mouth/Throat:     Mouth: Mucous membranes are moist.     Pharynx: Oropharynx is clear. No oropharyngeal exudate or posterior oropharyngeal erythema.  Eyes:     Conjunctiva/sclera: Conjunctivae normal.  Pulmonary:     Effort: Pulmonary effort is normal.   Abdominal:     Palpations: Abdomen is soft. There is no mass.     Tenderness: There is no abdominal tenderness. There is no guarding or rebound.  Genitourinary:    General: Normal vulva.     Rectum: Normal.     Comments: External genitalia/pubic area without nits, lice, edema, erythema and inguinal adenopathy. Vagina with normal mucosa, small amount of thick, white, clumping discharge. Cervix without visible lesions. Uterus firm, mobile, nt, no masses, no CMT, no adnexal tenderness or fullness. Musculoskeletal:     Cervical back: Neck supple. No tenderness.  Skin:    General: Skin is warm and dry.     Findings: No bruising, erythema, lesion or rash.  Neurological:     Mental Status: She is alert and oriented to person, place, and time.  Psychiatric:        Mood and Affect: Mood normal.        Behavior: Behavior normal.        Thought Content: Thought content normal.        Judgment: Judgment normal.      Assessment and Plan:  Doris West is a 39 y.o. female presenting to the [facility] Department for STI screening  1. Screening for STD (sexually transmitted disease) Patient into clinic with symptoms. Patient declines blood work today. Rec condoms with all sex. Await test results.  Counseled that RN will call if needs to RTC for treatment once results are back. -  WET PREP FOR TRICH, YEAST, CLUE - Chlamydia/Gonorrhea [place] [facility]  2. Candidal vulvovaginitis Will give Fluconazole 150 mg #1 take po one time. No sex for 7 days. - fluconazole (DIFLUCAN) 150 MG tablet; Take 1 tablet (150 mg total) by mouth once for 1 dose.  Dispense: 1 tablet; Refill: 0     No follow-ups on file.  No future appointments.  Matt Holmes, PA

## 2019-09-18 NOTE — Progress Notes (Signed)
Chart reviewed by Pharmacist  Suzanne Walker PharmD, Contract Pharmacist at [facility]  

## 2019-10-07 ENCOUNTER — Ambulatory Visit: Payer: Self-pay | Admitting: Family Medicine

## 2019-10-07 ENCOUNTER — Other Ambulatory Visit: Payer: Self-pay

## 2019-10-07 DIAGNOSIS — Z5321 Procedure and treatment not carried out due to patient leaving prior to being seen by health care provider: Secondary | ICD-10-CM

## 2019-10-07 NOTE — Progress Notes (Signed)
Patient left with out being seen, patient was late for appointment and was rude with staff because she had to wait.  When picking up patient she had thew bag back at clerical staff and got upset because she was asked to get bag from clerical staff.  Patient went to elevator and left.    Wendi Snipes, RN

## 2019-10-10 NOTE — Progress Notes (Signed)
Reviewed ERRN note.  Signing since [facility] not cleared to sign documentation yet.

## 2019-10-20 NOTE — Addendum Note (Signed)
Addended by: Heywood Bene on: 10/20/2019 11:59 AM   Modules accepted: Orders

## 2020-01-16 ENCOUNTER — Ambulatory Visit: Payer: Medicaid Other | Admitting: Physician Assistant

## 2020-01-16 ENCOUNTER — Encounter: Payer: Self-pay | Admitting: Physician Assistant

## 2020-01-16 ENCOUNTER — Other Ambulatory Visit: Payer: Self-pay

## 2020-01-16 DIAGNOSIS — B9689 Other specified bacterial agents as the cause of diseases classified elsewhere: Secondary | ICD-10-CM | POA: Diagnosis not present

## 2020-01-16 DIAGNOSIS — N760 Acute vaginitis: Secondary | ICD-10-CM | POA: Diagnosis not present

## 2020-01-16 DIAGNOSIS — Z113 Encounter for screening for infections with a predominantly sexual mode of transmission: Secondary | ICD-10-CM | POA: Diagnosis not present

## 2020-01-16 DIAGNOSIS — Z299 Encounter for prophylactic measures, unspecified: Secondary | ICD-10-CM

## 2020-01-16 LAB — WET PREP FOR TRICH, YEAST, CLUE
Trichomonas Exam: NEGATIVE
Yeast Exam: NEGATIVE

## 2020-01-16 MED ORDER — METRONIDAZOLE 0.75 % VA GEL: 1.0000 | Freq: Every day | VAGINAL | 0 refills | Status: DC

## 2020-01-16 MED ORDER — FLUCONAZOLE 150 MG PO TABS: 150.0000 mg | ORAL_TABLET | Freq: Once | ORAL | 0 refills | Status: AC

## 2020-01-16 NOTE — Progress Notes (Signed)
Patient here for STD screening, states she feels she has BV.Marland KitchenBurt Knack, RN

## 2020-01-16 NOTE — Progress Notes (Signed)
[facility] Department STI clinic/screening visit  Subjective:  Doris West is a 39 y.o. female being seen today for an STI screening visit. The patient reports they do have symptoms.  Patient reports that they do not desire a pregnancy in the next year.   They reported they are not interested in discussing contraception today.  No LMP recorded (lmp unknown). Patient has had an injection.   Patient has the following medical conditions:   Patient Active Problem List   Diagnosis Date Noted  . Smoker 1/2-1 ppd 06/06/2019  . Anxiety 06/06/2019  . Marijuana abuse daily since age 11 11/29/2018  . Bipolar 1 disorder (HCC) 06/24/2018    Chief Complaint  Patient presents with  . SEXUALLY TRANSMITTED DISEASE    screening    HPI  Patient reports that she has been having a vaginal odor for 2-3 days.  Denies other symptoms.  States last HIV test was earlier this year and last pap was 2-3 years ago.   See flowsheet for further details and programmatic requirements.    The following portions of the patient's history were reviewed and updated as appropriate: allergies, current medications, past medical history, past social history, past surgical history and problem list.  Objective:  There were no vitals filed for this visit.  Physical Exam Constitutional:      General: She is not in acute distress.    Appearance: Normal appearance.  HENT:     Head: Normocephalic and atraumatic.     Comments: No nits,lice, or hair loss. No cervical, supraclavicular or axillary adenopathy.    Mouth/Throat:     Mouth: Mucous membranes are moist.     Pharynx: Oropharynx is clear. No oropharyngeal exudate or posterior oropharyngeal erythema.  Eyes:     Conjunctiva/sclera: Conjunctivae normal.  Pulmonary:     Effort: Pulmonary effort is normal.  Abdominal:     Palpations: Abdomen is soft. There is no mass.     Tenderness: There is no abdominal tenderness. There is no guarding or  rebound.  Genitourinary:    General: Normal vulva.     Rectum: Normal.     Comments: External genitalia/pubic area without nits, lice, edema, erythema, lesions and inguinal adenopathy. Vagina with normal mucosa and small amount of pinkish/spotting as  discharge. Cervix without visible lesions. Uterus firm, mobile, nt, no masses, no CMT, no adnexal tenderness or fullness. Musculoskeletal:     Cervical back: Neck supple. No tenderness.  Skin:    General: Skin is warm and dry.     Findings: No bruising, erythema, lesion or rash.  Neurological:     Mental Status: She is alert and oriented to person, place, and time.  Psychiatric:        Mood and Affect: Mood normal.        Behavior: Behavior normal.        Thought Content: Thought content normal.        Judgment: Judgment normal.      Assessment and Plan:  Doris West is a 39 y.o. female presenting to the [facility] Department for STI screening  1. Screening for STD (sexually transmitted disease) Patient into clinic with symptoms. Patient declines blood work today. Reviewed with patient that per wet mount, she has BV.  Patient states that she would prefer treatment with gel or cream since she can not tolerate the pills. Rec condoms with all sex. Await test results.  Counseled that RN will call if needs to RTC for  treatment once results are back. - WET PREP FOR TRICH, YEAST, CLUE - Chlamydia/Gonorrhea [place] [facility]  2. BV (bacterial vaginosis) Will treat for BV with Vandazole 0.75% gel 1 app qhs for 7 days. No sex for 10 days. Enc to use OTC antifungal cream if has itching/irritation after using antibiotic get. - metroNIDAZOLE (VANDAZOLE) 0.75 % vaginal gel; Place 1 Applicatorful vaginally at bedtime.  Dispense: 70 g; Refill: 0  3. Prophylactic measure Patient states OTC cream does not usually work for her and requests Fluconazole to take after completing gel. Fluconazole 150 mg #1 take po if needed in 7-10  days. - fluconazole (DIFLUCAN) 150 MG tablet; Take 1 tablet (150 mg total) by mouth once for 1 dose.  Dispense: 1 tablet; Refill: 0     No follow-ups on file.  No future appointments.  Matt Holmes, PA

## 2020-01-29 NOTE — Progress Notes (Signed)
Chart reviewed by Pharmacist  Suzanne Walker PharmD, Contract Pharmacist at [facility]  

## 2020-06-13 ENCOUNTER — Other Ambulatory Visit: Payer: Self-pay | Admitting: Dentistry

## 2020-06-13 DIAGNOSIS — M545 Low back pain, unspecified: Secondary | ICD-10-CM

## 2020-06-13 DIAGNOSIS — M25561 Pain in right knee: Secondary | ICD-10-CM

## 2020-06-13 DIAGNOSIS — M25562 Pain in left knee: Secondary | ICD-10-CM

## 2020-06-26 ENCOUNTER — Ambulatory Visit
Admission: RE | Admit: 2020-06-26 | Discharge: 2020-06-26 | Disposition: A | Discharge status: Home / Self Care | Payer: Disability Insurance | Attending: Dentistry | Admitting: Dentistry

## 2020-06-26 ENCOUNTER — Ambulatory Visit
Admission: RE | Admit: 2020-06-26 | Discharge: 2020-06-26 | Disposition: A | Discharge status: Home / Self Care | Payer: Disability Insurance | Source: Ambulatory Visit | Attending: Dentistry | Admitting: Dentistry

## 2020-06-26 ENCOUNTER — Other Ambulatory Visit: Payer: Self-pay | Admitting: Dentistry

## 2020-06-26 DIAGNOSIS — M545 Low back pain, unspecified: Secondary | ICD-10-CM | POA: Diagnosis present

## 2020-06-26 DIAGNOSIS — M25562 Pain in left knee: Secondary | ICD-10-CM | POA: Insufficient documentation

## 2020-06-26 DIAGNOSIS — M25561 Pain in right knee: Secondary | ICD-10-CM

## 2020-08-20 ENCOUNTER — Ambulatory Visit: Payer: Medicaid Other

## 2020-08-30 ENCOUNTER — Other Ambulatory Visit: Payer: Self-pay

## 2020-08-30 ENCOUNTER — Emergency Department
Admission: EM | Admit: 2020-08-30 | Discharge: 2020-08-30 | Disposition: A | Discharge status: Home / Self Care | Payer: Medicaid Other | Attending: Emergency Medicine | Admitting: Emergency Medicine

## 2020-08-30 DIAGNOSIS — J029 Acute pharyngitis, unspecified: Secondary | ICD-10-CM | POA: Diagnosis present

## 2020-08-30 DIAGNOSIS — F17210 Nicotine dependence, cigarettes, uncomplicated: Secondary | ICD-10-CM | POA: Insufficient documentation

## 2020-08-30 DIAGNOSIS — R590 Localized enlarged lymph nodes: Secondary | ICD-10-CM | POA: Diagnosis not present

## 2020-08-30 DIAGNOSIS — B349 Viral infection, unspecified: Secondary | ICD-10-CM | POA: Insufficient documentation

## 2020-08-30 DIAGNOSIS — U071 COVID-19: Secondary | ICD-10-CM | POA: Insufficient documentation

## 2020-08-30 DIAGNOSIS — R6889 Other general symptoms and signs: Secondary | ICD-10-CM

## 2020-08-30 LAB — RESP PANEL BY RT-PCR (FLU A&B, COVID) ARPGX2
Influenza A by PCR: NEGATIVE
Influenza B by PCR: NEGATIVE
SARS Coronavirus 2 by RT PCR: POSITIVE — AB

## 2020-08-30 MED ORDER — PSEUDOEPH-BROMPHEN-DM 30-2-10 MG/5ML PO SYRP: 5.0000 mL | ORAL_SOLUTION | Freq: Four times a day (QID) | ORAL | 0 refills | Status: DC | PRN

## 2020-08-30 MED ORDER — IBUPROFEN 800 MG PO TABS
800.0000 mg | ORAL_TABLET | Freq: Once | ORAL | Status: AC
  Administered 2020-08-30: 800 mg via ORAL
  Filled 2020-08-30: qty 1

## 2020-08-30 NOTE — ED Notes (Addendum)
See triage note  Presents with body aches and subjective fever  States she was exposed to COVID on Sunday  Currently afebrile  Also states she "just feeling bad"   Having some chest discomfort

## 2020-08-30 NOTE — Discharge Instructions (Addendum)
Please alternate Tylenol and ibuprofen.  Make sure you are drinking lots of fluids.  Take cough medication as prescribed.  Return to the ER for any fevers above 101 that are not going down with Tylenol and ibuprofen, shortness of breath, worsening symptoms or to changes in your health

## 2020-08-30 NOTE — ED Triage Notes (Signed)
Pt toED for covid sx. Reports exposed on Sunday. States body aches and fever

## 2020-08-30 NOTE — ED Provider Notes (Signed)
[facility] EMERGENCY DEPARTMENT Provider Note   CSN: [id number] Arrival date & time: 08/30/20  1450     History Chief Complaint  Patient presents with   covid sx    Doris West is a 40 y.o. female presents to the emergency department for evaluation of a sore throat, chills, body aches, mild cough.  Symptoms been present for 2 to 3 days.  She took a home test that was positive for COVID today.  She denies productive cough, fevers.  No shortness of breath.  Has had some throat and upper chest discomfort.  No radiation of pain, nausea, vomiting.  Tolerating p.o. well.  She feels very fatigued and has lots of body aches.  HPI     Past Medical History:  Diagnosis Date   Anemia    Carpal tunnel syndrome    Chlamydia    Complication of anesthesia 11-2014   AFTER HERNIA SURGERY PT STATES SHE HAD TROUBLE SEEING, HER EYES WERE VERY SENSITIVE- THIS LASTED 12 HOURS AFTER HER SURGERY   Gestational diabetes    Gestational diabetes mellitus, antepartum 09/13/2014   Heart murmur    ASYMPTOMATIC   Plantar fasciitis    Ventral hernia     Patient Active Problem List   Diagnosis Date Noted   Smoker 1/2-1 ppd 06/06/2019   Anxiety 06/06/2019   Marijuana abuse daily since age 9 11/29/2018   Bipolar 1 disorder (HCC) 06/24/2018    Past Surgical History:  Procedure Laterality Date   BUNIONECTOMY Right 06/08/2015   Procedure: BUNIONECTOMY/ hallux valgus right foot;  Surgeon: Linus Galas, DPM;  Location: [facility] ORS;  Service: Podiatry;  Laterality: Right;   LAPAROSCOPIC OOPHERECTOMY Left 2016   UMBILICAL HERNIA REPAIR N/A 12/14/2014   Procedure: HERNIA REPAIR UMBILICAL ADULT;  Surgeon: Earline Mayotte, MD;  Location: [facility] ORS;  Service: General;  Laterality: N/A;   VENTRAL HERNIA REPAIR N/A 12/14/2014   Procedure: HERNIA REPAIR EPIGASTRIC  ADULT;  Surgeon: Earline Mayotte, MD;  Location: [facility] ORS;  Service: General;  Laterality: N/A;     OB History     Gravida  5    Para  2   Term  2   Preterm      AB  3   Living  2      SAB  3   IAB      Ectopic      Multiple  0   Live Births  2        Obstetric Comments  1st Menstrual Cycle:  13  1st Pregnancy:  16          Family History  Problem Relation Age of Onset   Diabetes Paternal Grandmother    Cancer Father        lung    Social History   Tobacco Use   Smoking status: Every Day    Packs/day: 0.25    Years: 15.00    Pack years: 3.75    Types: Cigarettes   Smokeless tobacco: Never  Vaping Use   Vaping Use: Never used  Substance Use Topics   Alcohol use: Yes    Comment: rarely   Drug use: Yes    Types: Marijuana    Comment: OCCASSIONALLY    Home Medications Prior to Admission medications   Not on File    Allergies    Penicillin g and Tuna [fish allergy]  Review of Systems   Review of Systems  Constitutional:  Positive for chills and  fatigue. Negative for fever.  HENT:  Positive for rhinorrhea and sore throat. Negative for congestion, ear discharge, sinus pressure, sinus pain, trouble swallowing and voice change.   Respiratory:  Positive for cough. Negative for shortness of breath, wheezing and stridor.   Cardiovascular:  Negative for chest pain.  Gastrointestinal:  Negative for abdominal pain, diarrhea, nausea and vomiting.  Genitourinary:  Negative for dysuria, flank pain and pelvic pain.  Musculoskeletal:  Positive for myalgias. Negative for back pain.  Skin:  Negative for rash.  Neurological:  Negative for dizziness and headaches.   Physical Exam Updated Vital Signs BP (!) 115/94   Pulse 70   Temp 98.5 F (36.9 C) (Oral)   Resp 20   Ht 5\' 8"  (1.727 m)   Wt 74.8 kg   SpO2 99%   BMI 25.09 kg/m   Physical Exam Constitutional:      Appearance: She is well-developed.  HENT:     Head: Normocephalic and atraumatic.     Nose: Nose normal.     Mouth/Throat:     Pharynx: No oropharyngeal exudate or posterior oropharyngeal erythema.  Eyes:      Conjunctiva/sclera: Conjunctivae normal.  Cardiovascular:     Rate and Rhythm: Normal rate.     Pulses: Normal pulses.     Heart sounds: Normal heart sounds.  Pulmonary:     Effort: Pulmonary effort is normal. No respiratory distress.     Breath sounds: Normal breath sounds. No stridor. No wheezing, rhonchi or rales.  Musculoskeletal:        General: Normal range of motion.     Cervical back: Normal range of motion and neck supple. No tenderness.  Lymphadenopathy:     Cervical: Cervical adenopathy present.  Skin:    General: Skin is warm.     Findings: No rash.  Neurological:     Mental Status: She is alert and oriented to person, place, and time.  Psychiatric:        Behavior: Behavior normal.        Thought Content: Thought content normal.    ED Results / Procedures / Treatments   Labs (all labs ordered are listed, but only abnormal results are displayed) Labs Reviewed  RESP PANEL BY RT-PCR (FLU A&B, COVID) [id number]    EKG None  Radiology No results found.  Procedures Procedures   Medications Ordered in ED Medications  ibuprofen (ADVIL) tablet 800 mg (has no administration in time range)    ED Course  I have reviewed the triage vital signs and the nursing notes.  Pertinent labs & imaging results that were available during my care of the patient were reviewed by me and considered in my medical decision making (see chart for details).    MDM Rules/Calculators/A&P                         57-year-old female with flulike symptoms.  Tested positive for COVID at home today.  PCR test is pending.  Vital signs are stable physical exam unremarkable.  Recommend continuing with over-the-counter medications such as Tylenol and ibuprofen for fevers chills body aches and taking Bromfed as needed for cough congestion  Final Clinical Impression(s) / ED Diagnoses Final diagnoses:  Viral illness  Flu-like symptoms    Rx / DC Orders ED Discharge Orders     None         10 08/30/20 1729    10/30/20, MD 08/30/20 2140

## 2020-10-07 ENCOUNTER — Emergency Department
Admission: EM | Admit: 2020-10-07 | Discharge: 2020-10-07 | Disposition: A | Discharge status: Home / Self Care | Payer: Medicaid Other | Attending: Emergency Medicine | Admitting: Emergency Medicine

## 2020-10-07 ENCOUNTER — Encounter: Payer: Self-pay | Admitting: Emergency Medicine

## 2020-10-07 ENCOUNTER — Other Ambulatory Visit: Payer: Self-pay

## 2020-10-07 ENCOUNTER — Emergency Department: Payer: Medicaid Other

## 2020-10-07 DIAGNOSIS — M25511 Pain in right shoulder: Secondary | ICD-10-CM | POA: Diagnosis not present

## 2020-10-07 DIAGNOSIS — F17210 Nicotine dependence, cigarettes, uncomplicated: Secondary | ICD-10-CM | POA: Insufficient documentation

## 2020-10-07 DIAGNOSIS — M436 Torticollis: Secondary | ICD-10-CM | POA: Diagnosis not present

## 2020-10-07 DIAGNOSIS — M542 Cervicalgia: Secondary | ICD-10-CM | POA: Diagnosis present

## 2020-10-07 MED ORDER — METHOCARBAMOL 500 MG PO TABS: 500.0000 mg | ORAL_TABLET | Freq: Three times a day (TID) | ORAL | 0 refills | Status: DC | PRN

## 2020-10-07 MED ORDER — ACETAMINOPHEN 325 MG PO TABS
650.0000 mg | ORAL_TABLET | Freq: Once | ORAL | Status: AC
  Administered 2020-10-07: 650 mg via ORAL
  Filled 2020-10-07: qty 2

## 2020-10-07 MED ORDER — ETODOLAC 200 MG PO CAPS: 200.0000 mg | ORAL_CAPSULE | Freq: Three times a day (TID) | ORAL | 0 refills | Status: DC

## 2020-10-07 NOTE — Discharge Instructions (Signed)
You were seen today for acute neck pain radiating to the right shoulder.  The x-ray of your cervical spine showed some mild scoliosis but no acute bony findings.  The x-ray of the right shoulder was normal.  We recommend heat massage.  I have sent in a muscle relaxer for you to take every 8 hours as needed.  Please be aware that this may cause sedation.  I have sent in a pain medication/anti-inflammatory to for you to take every 8 hours as needed with food.

## 2020-10-07 NOTE — ED Triage Notes (Signed)
Pt reports pain to the right side of her neck and shoulder for 2 days. Pt sates it has happened before and usually it will just go away but this time it has not.
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
## 2020-10-07 NOTE — ED Notes (Addendum)
Pt walking to room and scolding her daughter as they enter the room from triage. No signs of distress. Then complained, I the RN from the nurses station was staring at her as she entered her room.   RN to bedside. Pt complaint of neck pain x 2 months. Pt has taken all OTC medications with no relief. Pt denies any falls or trauma to her neck. Pt in no sign of distress.

## 2021-05-13 ENCOUNTER — Ambulatory Visit: Payer: Medicaid Other

## 2021-05-16 ENCOUNTER — Ambulatory Visit: Payer: Medicaid Other

## 2021-06-16 ENCOUNTER — Emergency Department: Payer: Medicaid Other

## 2021-06-16 ENCOUNTER — Encounter: Payer: Self-pay | Admitting: Emergency Medicine

## 2021-06-16 ENCOUNTER — Other Ambulatory Visit: Payer: Self-pay

## 2021-06-16 ENCOUNTER — Emergency Department
Admission: EM | Admit: 2021-06-16 | Discharge: 2021-06-16 | Disposition: A | Discharge status: Home / Self Care | Payer: Medicaid Other | Attending: Emergency Medicine | Admitting: Emergency Medicine

## 2021-06-16 DIAGNOSIS — B9689 Other specified bacterial agents as the cause of diseases classified elsewhere: Secondary | ICD-10-CM

## 2021-06-16 DIAGNOSIS — J209 Acute bronchitis, unspecified: Secondary | ICD-10-CM | POA: Insufficient documentation

## 2021-06-16 DIAGNOSIS — R0602 Shortness of breath: Secondary | ICD-10-CM | POA: Diagnosis present

## 2021-06-16 MED ORDER — AZITHROMYCIN 250 MG PO TABS: ORAL_TABLET | ORAL | 0 refills | Status: AC

## 2021-06-16 MED ORDER — PREDNISONE 10 MG PO TABS: ORAL_TABLET | ORAL | 0 refills | Status: DC

## 2021-06-16 MED ORDER — IPRATROPIUM-ALBUTEROL 0.5-2.5 (3) MG/3ML IN SOLN
3.0000 mL | Freq: Once | RESPIRATORY_TRACT | Status: AC
  Administered 2021-06-16: 3 mL via RESPIRATORY_TRACT
  Filled 2021-06-16: qty 3

## 2021-06-16 NOTE — ED Provider Notes (Signed)
[facility] Provider Note    Event Date/Time   First MD Initiated Contact with Patient 06/16/21 [phone]     (approximate)   History   Cough and Nasal Congestion   HPI  Doris West is a 41 y.o. female who is a smoker, presents to the ER today with complaint of cough and shortness of breath.  She reports this started 1 month ago.  She reports initially started with runny nose, nasal congestion and postnasal drip which seems to have resolved but has since drained down into her chest.  She reports the cough is productive of green/brown mucus.  She denies overt chest pain but does have some discomfort associated with the cough and shortness of breath.  She has never been diagnosed with asthma but does feel like she probably has asthma.  She has tried allergy medicines, nasal sprays, Mucinex and inhalers with minimal relief of symptoms.  She has not had sick contacts diagnosed with COVID or flu.      Physical Exam   Triage Vital Signs: ED Triage Vitals  Enc Vitals Group     BP 06/16/21 0859 109/67     Pulse Rate 06/16/21 0859 70     Resp 06/16/21 0859 16     Temp 06/16/21 0859 97.8 F (36.6 C)     Temp Source 06/16/21 0859 Oral     SpO2 06/16/21 0859 96 %     Weight 06/16/21 0851 160 lb 15 oz (73 kg)     Height 06/16/21 0851 5\' 8"  (1.727 m)     Head Circumference --      Peak Flow --      Pain Score 06/16/21 0851 0     Pain Loc --      Pain Edu? --      Excl. in [place]? --     Most recent vital signs: Vitals:   06/16/21 0859  BP: 109/67  Pulse: 70  Resp: 16  Temp: 97.8 F (36.6 C)  SpO2: 96%     General: Awake, no distress.  CV:  RRR. Resp:  Normal effort.  Intermittent expiratory wheeze with scattered rhonchi noted throughout.  No rales.    ED Results / Procedures / Treatments    RADIOLOGY  Imaging Orders         DG Chest 2 View    IMPRESSION: No active cardiopulmonary disease.      MEDICATIONS ORDERED IN ED: Medications   ipratropium-albuterol (DUONEB) 0.5-2.5 (3) MG/3ML nebulizer solution 3 mL (3 mLs Nebulization Given 06/16/21 0955)     IMPRESSION / MDM / ASSESSMENT AND PLAN / ED COURSE  I reviewed the triage vital signs and the nursing notes.  Cough, SOB  Differential diagnosis includes, but is not limited to, viral bronchitis, pneumonia, smoker's cough, viral URI with cough  Chest x-ray does not show any airway inflammation or infiltrate per my read, confirmed by radiology Given her physical exam the we will treat this as a bacterial bronchitis, discussed findings with patient, she is agreeable with treatment, discharge home with PCP follow-up Rx for Pred taper x6 days Rx for Azithromycin 250 mg x5 days  FINAL CLINICAL IMPRESSION(S) / ED DIAGNOSES   Final diagnoses:  Acute bacterial bronchitis     Rx / DC Orders   ED Discharge Orders          Ordered    predniSONE (DELTASONE) 10 MG tablet        06/16/21 1018  azithromycin (ZITHROMAX Z-PAK) 250 MG tablet        06/16/21 1018             Note:  This document was prepared using Dragon voice recognition software and may include unintentional dictation errors.    Jearld Fenton, NP 06/16/21 1019    Arta Silence, MD 06/16/21 [phone]

## 2021-06-16 NOTE — ED Triage Notes (Signed)
Pt reports started with allergy type symptoms several weeks ago and the cough and congestion is lingering. Denies all other sx's, denies fevers, SOB, pain,  states still just coughing phlem up that is yellow green in color.

## 2021-06-16 NOTE — ED Notes (Signed)
Pt with c/o congestion x 2 weeks, pt with cough and stuffy nose. Pt alert, talking in complete sentences, NAD at this time.

## 2021-06-16 NOTE — ED Notes (Signed)
Patient transported to X-ray 

## 2021-06-16 NOTE — Discharge Instructions (Signed)
You were seen today for cough and shortness of breath.  Your chest x-ray did not show any evidence of pneumonia.  I am treating you with antibiotics and steroids.  Please take these medications as prescribed.  Follow-up with your PCP if symptoms persist.

## 2021-07-23 ENCOUNTER — Ambulatory Visit: Payer: Medicaid Other | Admitting: Family Medicine

## 2021-07-23 ENCOUNTER — Encounter: Payer: Self-pay | Admitting: Family Medicine

## 2021-07-23 DIAGNOSIS — Z113 Encounter for screening for infections with a predominantly sexual mode of transmission: Secondary | ICD-10-CM

## 2021-07-23 LAB — WET PREP FOR TRICH, YEAST, CLUE
Trichomonas Exam: NEGATIVE
Yeast Exam: NEGATIVE

## 2021-07-26 ENCOUNTER — Encounter: Payer: Self-pay | Admitting: Family Medicine

## 2021-08-01 ENCOUNTER — Ambulatory Visit: Payer: Medicaid Other | Admitting: Advanced Practice Midwife

## 2021-08-01 ENCOUNTER — Encounter: Payer: Self-pay | Admitting: Advanced Practice Midwife

## 2021-08-01 DIAGNOSIS — Z113 Encounter for screening for infections with a predominantly sexual mode of transmission: Secondary | ICD-10-CM | POA: Diagnosis not present

## 2021-08-01 LAB — WET PREP FOR TRICH, YEAST, CLUE
Trichomonas Exam: NEGATIVE
Yeast Exam: NEGATIVE

## 2021-08-01 LAB — HM HIV SCREENING LAB: HM HIV Screening: NEGATIVE

## 2021-08-01 NOTE — Progress Notes (Signed)
[facility] Department  STI clinic/screening visit [address] [place] [address] [phone]  Subjective:  Doris West is a 41 y.o. SBF G5P2 smoker female being seen today for an STI screening visit. The patient reports they do have symptoms.  Patient reports that they do not desire a pregnancy in the next year.   They reported they are not interested in discussing contraception today.    Patient's last menstrual period was 07/20/2021.   Patient has the following medical conditions:   Patient Active Problem List   Diagnosis Date Noted   Smoker 1/2-1 ppd 06/06/2019   Anxiety 06/06/2019   Marijuana abuse daily since age 15 11/29/2018   Bipolar 1 disorder (HCC) 06/24/2018    Chief Complaint  Patient presents with   SEXUALLY TRANSMITTED DISEASE    Pt here for STI screening which she just had on 6/27. Endorses vaginal discharge    HPI  Patient reports here with 46 yo daughter and c/o white d/c past couple days. Pt doesn't want to answer questions in front of daughter and doesn't want to send daughter outside exam room. LMP 07/20/21. Last sex 06/29/21 without condom. Smoking 3-7 cpd. Last vaped yesterday. Last MJ 07/30/21.   Last HIV test per patient/review of record was 06/13/19 Patient reports last pap was Pt doesn't know.   Screening for MPX risk: Does the patient have an unexplained rash? No Is the patient MSM? No Does the patient endorse multiple sex partners or anonymous sex partners? No Did the patient have close or sexual contact with a person diagnosed with MPX? No Has the patient traveled outside the [place] where MPX is endemic? No Is there a high clinical suspicion for MPX-- evidenced by one of the following No  -Unlikely to be chickenpox  -Lymphadenopathy  -Rash that present in same phase of evolution on any given body part See flowsheet for further details and programmatic requirements.   Immunization history:  Immunization History   Administered Date(s) Administered   Tdap 03/11/2005     The following portions of the patient's history were reviewed and updated as appropriate: allergies, current medications, past medical history, past social history, past surgical history and problem list.  Objective:  There were no vitals filed for this visit.  Physical Exam Vitals and nursing note reviewed.  Constitutional:      Appearance: Normal appearance. She is normal weight.  HENT:     Head: Normocephalic and atraumatic.     Mouth/Throat:     Mouth: Mucous membranes are moist.     Pharynx: Oropharynx is clear. No oropharyngeal exudate or posterior oropharyngeal erythema.  Eyes:     Conjunctiva/sclera: Conjunctivae normal.  Pulmonary:     Effort: Pulmonary effort is normal.  Abdominal:     Palpations: Abdomen is soft. There is no mass.     Tenderness: There is no abdominal tenderness. There is no rebound.     Comments: Soft without masses or tenderness, good tone  Genitourinary:    Pubic Area: No rash or pubic lice.      Labia:        Right: No rash or lesion.        Left: No rash or lesion.      Vagina: No vaginal discharge, erythema, bleeding or lesions.     Cervix: No cervical motion tenderness, discharge, friability, lesion or erythema.     Comments: pH = not done because pt insists on self collecting so exam not done  below waist Lymphadenopathy:     Head:     Right side of head: No preauricular or posterior auricular adenopathy.     Left side of head: No preauricular or posterior auricular adenopathy.     Cervical: No cervical adenopathy.     Right cervical: No superficial, deep or posterior cervical adenopathy.    Left cervical: No superficial, deep or posterior cervical adenopathy.     Upper Body:     Right upper body: No supraclavicular, axillary or epitrochlear adenopathy.     Left upper body: No supraclavicular, axillary or epitrochlear adenopathy.     Lower Body: No right inguinal adenopathy. No  left inguinal adenopathy.  Skin:    General: Skin is warm and dry.     Findings: No rash.  Neurological:     Mental Status: She is alert and oriented to person, place, and time.     Assessment and Plan:  Doris West is a 41 y.o. female presenting to the [facility] Department for STI screening  1. Screening examination for venereal disease Treat wet mount per standing orders Immunization nurse consult  - WET PREP FOR TRICH, YEAST, CLUE - Chlamydia/Gonorrhea [place] [facility] - HIV [place] [facility] - Syphilis Serology, [place] [facility]     No follow-ups on file.  Future Appointments  Date Time Provider Department Center  12/10/2021  3:00 PM Midge Minium, MD [facility] None    Alberteen Spindle, CNM

## 2021-12-10 ENCOUNTER — Encounter: Payer: Self-pay | Admitting: Gastroenterology

## 2021-12-10 ENCOUNTER — Ambulatory Visit (INDEPENDENT_AMBULATORY_CARE_PROVIDER_SITE_OTHER): Payer: Medicaid Other | Admitting: Gastroenterology

## 2021-12-10 VITALS — BP 117/76 | HR 70 | Temp 98.5°F | Ht 68.0 in | Wt 156.0 lb

## 2021-12-10 DIAGNOSIS — K219 Gastro-esophageal reflux disease without esophagitis: Secondary | ICD-10-CM | POA: Diagnosis not present

## 2021-12-10 DIAGNOSIS — R1319 Other dysphagia: Secondary | ICD-10-CM

## 2021-12-10 MED ORDER — PANTOPRAZOLE SODIUM 40 MG PO TBEC: 40.0000 mg | DELAYED_RELEASE_TABLET | Freq: Every day | ORAL | 5 refills | Status: AC

## 2021-12-10 NOTE — Patient Instructions (Signed)
Stop Omeprazole  Start Pantoprazole 40 MG daily  Prescription has been sent to the pharmacy

## 2021-12-10 NOTE — Progress Notes (Signed)
Gastroenterology Consultation  Referring Provider:     Oswaldo Conroy, MD Primary Care Physician:  Oswaldo Conroy, MD Primary Gastroenterologist:  Dr. Servando Snare     Reason for Consultation:     Chest pressure        HPI:   Doris West is a 40 y.o. y/o female referred for consultation & management of chest pressure by Dr. Hessie Diener, Earl Lagos, MD. this patient comes in today with a history of problems since she had her 40 year old and her 62-year-old.  She states that she has had feeling of pressure in her chest with it going up her neck.  She thinks it may be related to reflux because it is made worse by things she eats.  She has been trying to stay away from coffee and other caffeinated products she also stays away from tomato sauce and tomato paste.  She denies any overt heartburn but does state that she wakes up in the melanite or whenever she lays down to fall asleep and feels like she is choking.  There is no report of any unexplained weight loss fevers chills nausea vomiting black stools or bloody stools.  She does report that there is episodes where she feels food is going down slowly..  Past Medical History:  Diagnosis Date   Anemia    Carpal tunnel syndrome    Chlamydia    Complication of anesthesia 11-2014   AFTER HERNIA SURGERY PT STATES SHE HAD TROUBLE SEEING, HER EYES WERE VERY SENSITIVE- THIS LASTED 12 HOURS AFTER HER SURGERY   Gestational diabetes    Gestational diabetes mellitus, antepartum 09/13/2014   Heart murmur    ASYMPTOMATIC   Plantar fasciitis    Ventral hernia     Past Surgical History:  Procedure Laterality Date   BUNIONECTOMY Right 06/08/2015   Procedure: BUNIONECTOMY/ hallux valgus right foot;  Surgeon: Linus Galas, DPM;  Location: [facility];  Service: Podiatry;  Laterality: Right;   LAPAROSCOPIC OOPHERECTOMY Left 2016   UMBILICAL HERNIA REPAIR N/A 12/14/2014   Procedure: HERNIA REPAIR UMBILICAL ADULT;  Surgeon: Earline Mayotte, MD;  Location:  [facility];  Service: General;  Laterality: N/A;   VENTRAL HERNIA REPAIR N/A 12/14/2014   Procedure: HERNIA REPAIR EPIGASTRIC  ADULT;  Surgeon: Earline Mayotte, MD;  Location: [facility];  Service: General;  Laterality: N/A;    Prior to Admission medications   Medication Sig Start Date End Date Taking? Authorizing Provider  brompheniramine-pseudoephedrine-DM 30-2-10 MG/5ML syrup Take 5 mLs by mouth 4 (four) times daily as needed. Patient not taking: Reported on 07/23/2021 08/30/20   Evon Slack, PA-C  etodolac (LODINE) 200 MG capsule Take 1 capsule (200 mg total) by mouth every 8 (eight) hours. Patient not taking: Reported on 07/23/2021 10/07/20   Lorre Munroe, NP  methocarbamol (ROBAXIN) 500 MG tablet Take 1 tablet (500 mg total) by mouth every 8 (eight) hours as needed for muscle spasms. Patient not taking: Reported on 07/23/2021 10/07/20   Lorre Munroe, NP  omeprazole (PRILOSEC) 20 MG capsule Take 20 mg by mouth every morning. 07/23/21   [provider]  predniSONE (DELTASONE) 10 MG tablet Take 6 tabs on day 1, 5 tabs on day 2, 4 tabs on day 3, 3 tabs on day 4, 2 tabs on day 5, 1 tab on day 6 Patient not taking: Reported on 07/23/2021 06/16/21   Lorre Munroe, NP  sertraline (ZOLOFT) 25 MG tablet Take by mouth. 07/25/21   [provider]    Family History  Problem Relation Age of Onset   Diabetes Paternal Grandmother    Cancer Father        lung     Social History   Tobacco Use   Smoking status: Every Day    Packs/day: 0.25    Years: 15.00    Total pack years: 3.75    Types: Cigarettes, E-cigarettes   Smokeless tobacco: Never  Vaping Use   Vaping Use: Never used  Substance Use Topics   Alcohol use: Yes   Drug use: Yes    Types: Marijuana    Comment: last use 07/30/21    Allergies as of 12/10/2021 - Review Complete 08/01/2021  Allergen Reaction Noted   Penicillin g Hives and Swelling 02/14/2014   [place] Gum [fish allergy] Hives 12/08/2014    Review of  Systems:    All systems reviewed and negative except where noted in HPI.   Physical Exam:  There were no vitals taken for this visit. No LMP recorded. General:   Alert,  Well-developed, well-nourished, pleasant and cooperative in NAD Head:  Normocephalic and atraumatic. Eyes:  Sclera clear, no icterus.   Conjunctiva pink. Ears:  Normal auditory acuity. Neck:  Supple; no masses or thyromegaly. Lungs:  Respirations even and unlabored.  Clear throughout to auscultation.   No wheezes, crackles, or rhonchi. No acute distress. Heart:  Regular rate and rhythm; no murmurs, clicks, rubs, or gallops. Abdomen:  Normal bowel sounds.  No bruits.  Soft, non-tender and non-distended without masses, hepatosplenomegaly or hernias noted.  No guarding or rebound tenderness.  Negative Carnett sign.   Rectal:  Deferred.  Pulses:  Normal pulses noted. Extremities:  No clubbing or edema.  No cyanosis. Neurologic:  Alert and oriented x3;  grossly normal neurologically. Skin:  Intact without significant lesions or rashes.  No jaundice. Lymph Nodes:  No significant cervical adenopathy. Psych:  Alert and cooperative. Normal mood and affect.  Imaging Studies: No results found.  Assessment and Plan:   Doris West is a 41 y.o. y/o female who comes in today with a history of chest pressure with symptoms not being relieved by omeprazole 20 mg.  The patient will be switched to pantoprazole 40 mg.  The patient will also be set up for an EGD to rule out any strictures or narrowing or Barrett's esophagus with her symptoms of what sounds like longstanding reflux symptoms.  The patient has been explained the plan agrees with it.    Midge Minium, MD. Clementeen Graham    Note: This dictation was prepared with Dragon dictation along with smaller phrase technology. Any transcriptional errors that result from this process are unintentional.

## 2021-12-11 NOTE — Addendum Note (Signed)
Addended by: Roena Malady on: 12/11/2021 01:43 PM   Modules accepted: Orders

## 2022-01-02 IMAGING — CR DG CERVICAL SPINE 2 OR 3 VIEWS
1 series · 3 of 3 positions shown · non-contrast
Comparison: None.

CLINICAL DATA: Right neck pain for 2 months.

EXAM:
CERVICAL SPINE - 2-3 VIEW

[Series 1: dg cervical spine 2 or 3 views · 0.14mm/px · 3 of 3 slices shown]
[im 1/3]
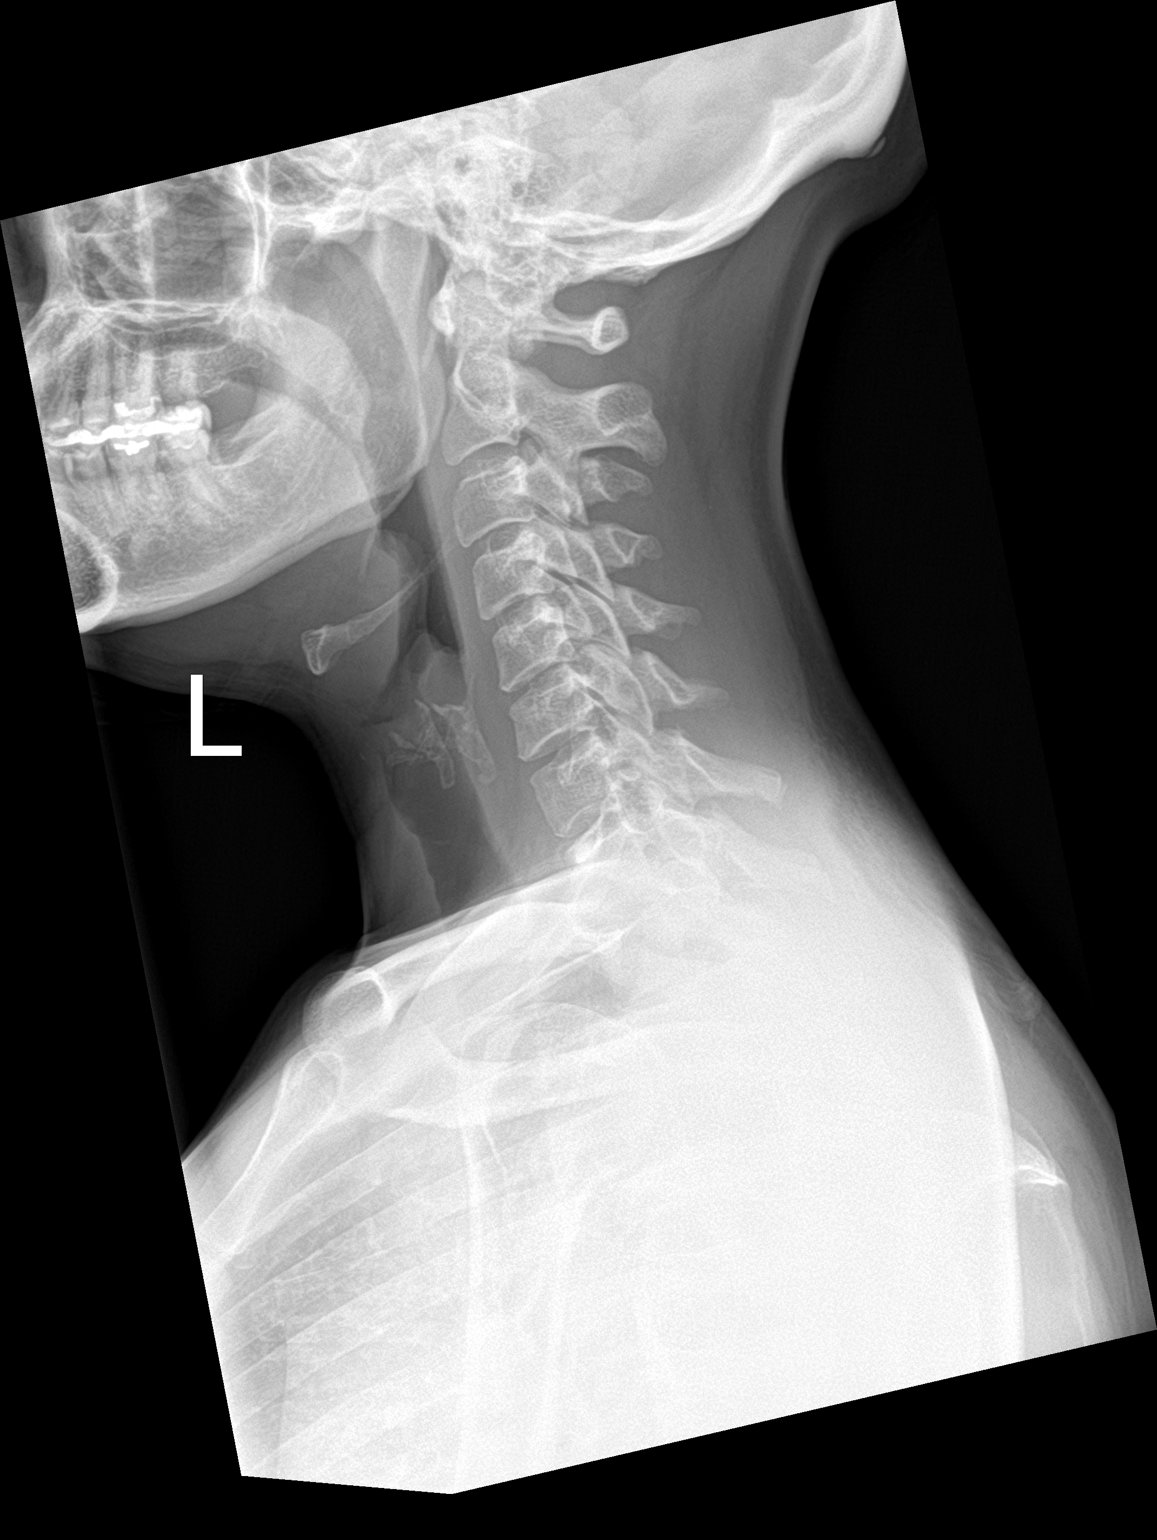
[im 2/3]
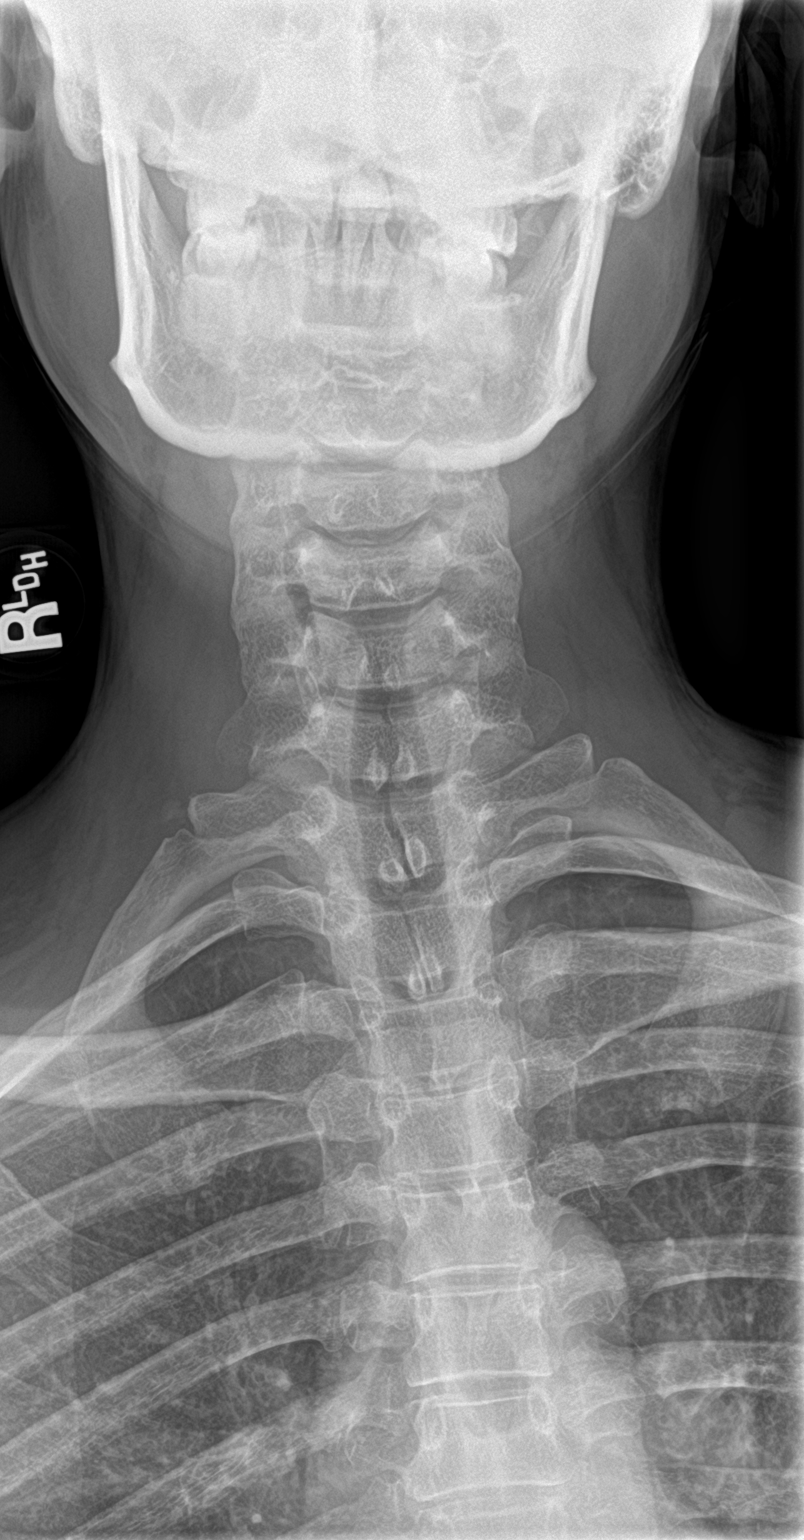
[im 3/3]
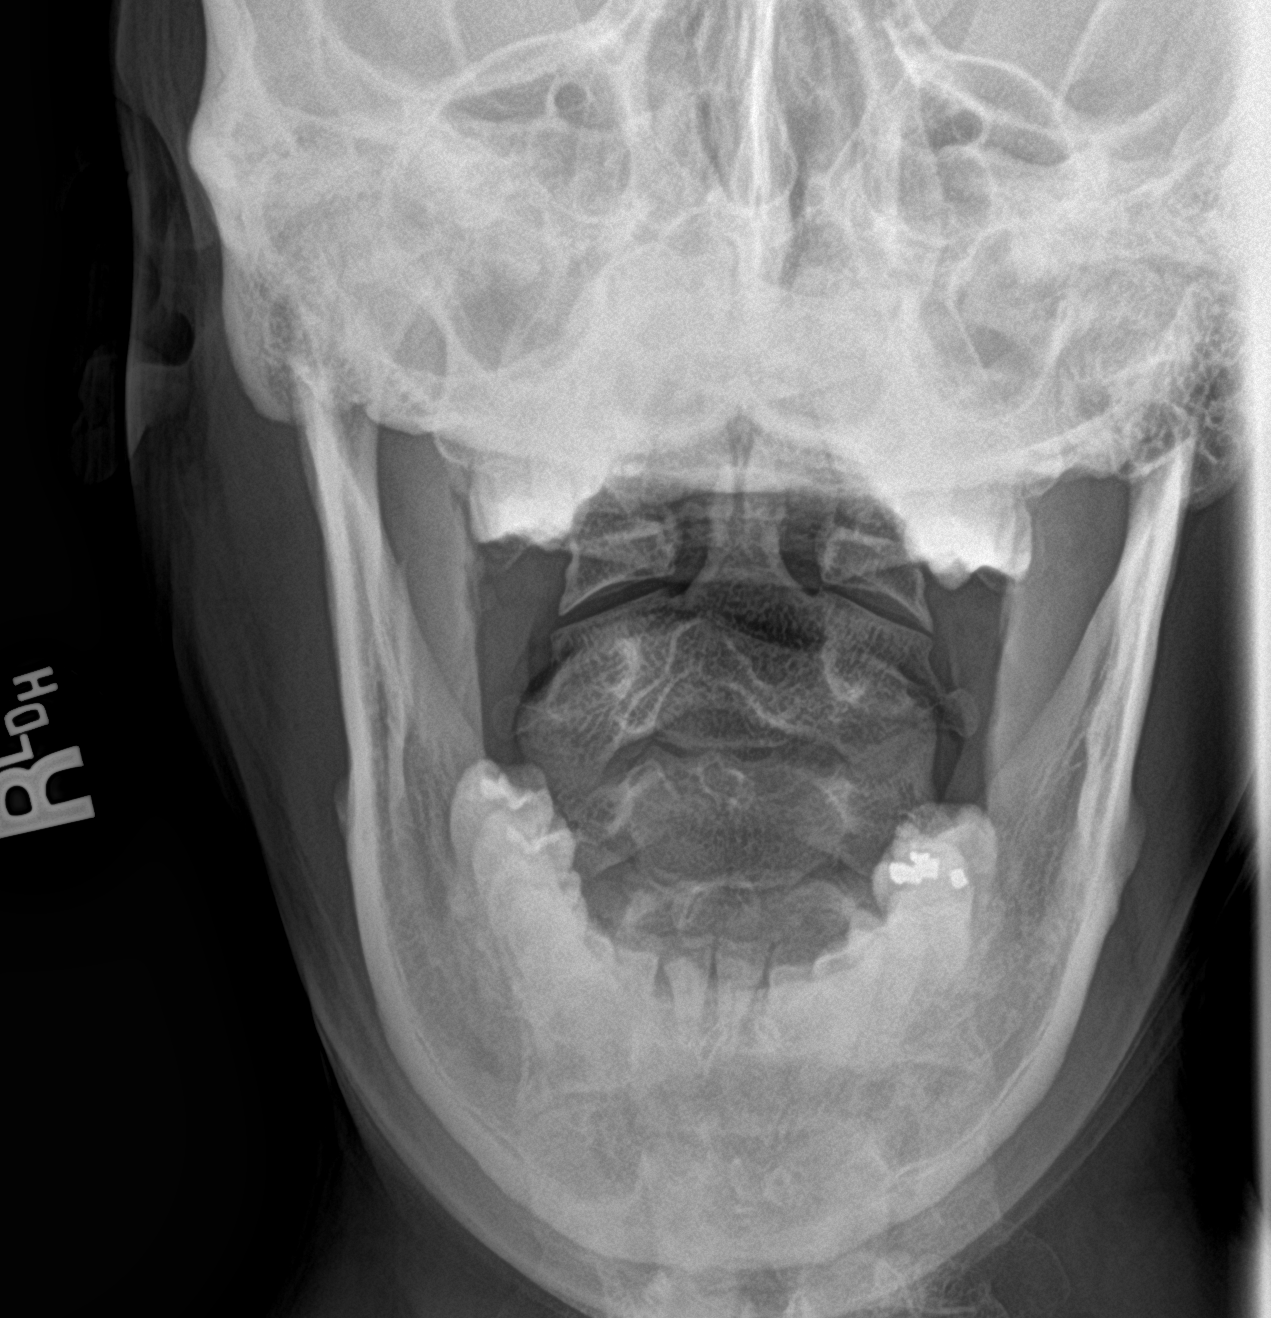

[3 of 3 positions shown; findings below may reference images not displayed]

FINDINGS: There is no evidence of cervical spine fracture or prevertebral soft
tissue swelling. There is mild scoliosis of the upper thoracic
spine. No other significant bone abnormalities are identified.
IMPRESSION: No acute fracture or dislocation noted. Mild scoliosis of upper
thoracic spine.

## 2022-01-06 ENCOUNTER — Telehealth: Payer: Self-pay | Admitting: Gastroenterology

## 2022-01-06 NOTE — Telephone Encounter (Signed)
Patient called wanting to reschedule her upper EGD. Patient requesting call back.

## 2022-01-07 ENCOUNTER — Ambulatory Visit: Admission: RE | Admit: 2022-01-07 | Payer: Medicaid Other | Source: Ambulatory Visit | Admitting: Gastroenterology

## 2022-01-07 ENCOUNTER — Encounter: Admission: RE | Payer: Self-pay | Source: Ambulatory Visit

## 2022-01-07 SURGERY — ESOPHAGOGASTRODUODENOSCOPY (EGD) WITH PROPOFOL
Anesthesia: General

## 2022-01-09 NOTE — Telephone Encounter (Signed)
Left message on voicemail Next available date 01/28/22 at [facility]

## 2022-01-14 NOTE — Telephone Encounter (Signed)
No answer, lmovm for pt to return my call

## 2023-12-29 ENCOUNTER — Ambulatory Visit: Payer: MEDICAID

## 2023-12-29 DIAGNOSIS — B3731 Acute candidiasis of vulva and vagina: Secondary | ICD-10-CM

## 2023-12-29 DIAGNOSIS — Z113 Encounter for screening for infections with a predominantly sexual mode of transmission: Secondary | ICD-10-CM

## 2023-12-29 DIAGNOSIS — N760 Acute vaginitis: Secondary | ICD-10-CM

## 2023-12-29 LAB — WET PREP FOR TRICH, YEAST, CLUE
Clue Cell Exam: NEGATIVE
Trichomonas Exam: NEGATIVE

## 2023-12-29 MED ORDER — FLUCONAZOLE 150 MG PO TABS: 150.0000 mg | ORAL_TABLET | ORAL | 0 refills | Status: DC

## 2023-12-29 MED ORDER — METRONIDAZOLE 0.75 % EX GEL: 1.0000 | Freq: Every day | CUTANEOUS | Status: DC

## 2023-12-29 MED ORDER — FLUCONAZOLE 150 MG PO TABS: 150.0000 mg | ORAL_TABLET | ORAL | Status: AC

## 2023-12-29 NOTE — Progress Notes (Signed)
 Pt is here for STD screening. Wet prep results reviewed with patient. The patient was dispensed # 2 Diflucan  150 mg tablet today. I provided counseling today regarding the medication, the side effects and when to call clinic. Patient given the opportunity to ask questions for any clarifications. Questions answered. Condoms declined. Wilkie Drought, RN.

## 2023-12-29 NOTE — Patient Instructions (Signed)
 STI screening - Today we obtained a vaginal swab to screen for gonorrhea, chlamydia, and trichomonas - If the results are abnormal, I will give you a call.    Estimated time frame for results collected at the [facility] Department: Same day Trichomonas Yeast BV (bacterial vaginosis)  Within 3-5 days Gonorrhea Chlamydia  Within 1 week HIV Syphilis Hepatitis B Hepatitis C

## 2023-12-29 NOTE — Progress Notes (Unsigned)
 [facility] Department STI clinic [address], [address] [place] [address] Main phone: [phone]  STI screening visit  Subjective:  Doris West is a 43 y.o. female being seen today for an STI screening visit. The patient reports they do have symptoms.    Patient has the following medical conditions:  Patient Active Problem List   Diagnosis Date Noted   Smoker 1/2-1 ppd 06/06/2019   Anxiety 06/06/2019   Marijuana abuse daily since age 48 11/29/2018   Bipolar 1 disorder (HCC) 06/24/2018   Chief Complaint  Patient presents with   SEXUALLY TRANSMITTED DISEASE   HPI Patient reports odor and discharge x several days  New sex partner 12/18/23  LMP 11/22-26  Reproductive considerations Patient reports they are not pregnant . They do not desire a pregnancy in the next year. Patient is currently using female condom to prevent pregnancy. They reported they are not interested in discussing contraception today.    Patient's last menstrual period was 12/19/2023 (exact date).  Does the patient using douching products? No  Patient's routine cervical screening is overdue.  See flowsheet for further details and programmatic requirements Hyperlink available at the top of the signed note in blue.  Flow sheet content below:  Pregnancy Intention Screening Does the patient want to become pregnant in the next year?: No Does the patient's partner want to become pregnant in the next year?: No Would the patient like to discuss contraceptive options today?: No Counseling Medication side effects discussed with patient?: Yes Patient counseled to abstain from sex for: 10 days Patient counseled to avoid alcohol for?: 5 days Patient counseled to use condoms with all sex: Condoms declined RTC in 2-3 weeks for test results: Yes Clinic will call if test results abnormal before test result appt.: Yes Patient should return to the clinic for re-treatment if vomits within  2 hours after taking meds   : Yes Test results given to patient Patient counseled to use condoms with all sex: Condoms declined STD Treatment Patient counseled to abstain from sex for: 10 days Patient counseled to avoid alcohol for?: 5 days   Screening for MPX risk:  Unexplained rash?  No   MSM?  No   Multiple or anonymous sex partners?  No   Any close or sexual contact with a person  diagnosed with MPX?  No   Any outside the [place]  where MPX is endemic?  No   High clinical suspicion for MPX?    -Unlikely to be chickenpox    -Lymphadenopathy    -Rash that presents in same phase of       evolution on any given body part  No   Does this patient meet CDC recommendations for vaccination against MPOX? No  You already have or anticipate having the following risks:  Your sex partner has the following risks: You're traveling to a county with a clade I MPOX outbreak and anticipate these risks: Occupational exposure  You had known or suspected exposure to someone with monkeypox You had a sex partner in the past 2 weeks who was diagnosed with monkeypox You are a gay, bisexual, or other man who has sex with men, or are transgender or nonbinary and in the past 6 months have had any of the following: - A new diagnosis of one or more sexually transmitted diseases (e.g., chlamydia, gonorrhea, or syphilis) - More than one sex partner You have had any of the following in the past 6 months: - Sex at  a commercial sex venue (like a sex club or bathhouse) - Sex related to a large commercial event   or in a geographic area (city or county for example) where mpox virus transmission is occurring Sex with a new partner Sex at a commercial sex venue (e.g., a sex club or bathhouse) Sex in it consultant for money, goods, drugs, or other trade Sex in association with a large public event (e.g., a rave, party, or festival) i.e. certain people who work in a tax adviser or healthcare facility   Infectious disease  screenings: Vaccinated against HPV? No  HIV Ever had a positive? No Last test: 08/01/2021 Results in chart:  Lab Results  Component Value Date   HMHIVSCREEN Negative - Validated 08/01/2021   No results found for: HIV   Hep B Hep B status: negative on 06/10/2019 Received HBV vaccination? Unknown - none on [facility] query Received HBV testing for immunity? No Results in chart:  No components found for: HMHEPBSCREEN  Do they qualify for HBV screening today? No  Hep C Hep C status: negative on 06/13/2019 Results in chart:  Lab Results  Component Value Date   HMHEPCSCREEN Negative-Validated 06/13/2019   No components found for: HEPC  Do they qualify for HCV screening today? No  Immunization history:  Immunization History  Administered Date(s) Administered   Tdap 03/11/2005    The following portions of the patient's history were reviewed and updated as appropriate: allergies, current medications, past medical history, past social history, past surgical history and problem list.  Substance use screenings:  Uses tobacco products? No Uses vapes? No Uses alcohol? No Uses non-injectable substances that alter your mental status? No Uses non-prescribed injectable substances? No  Objective:  There were no vitals filed for this visit.  Physical Exam Vitals and nursing note reviewed. Chaperone present: declines chaperone.  Constitutional:      Appearance: Normal appearance.  HENT:     Head: Normocephalic and atraumatic.     Comments: No nits or hair loss of scalp, brows, and lashes    Mouth/Throat:     Mouth: Mucous membranes are moist.     Pharynx: Oropharynx is clear. No oropharyngeal exudate or posterior oropharyngeal erythema.  Eyes:     General:        Right eye: No discharge.        Left eye: No discharge.     Conjunctiva/sclera: Conjunctivae normal.     Right eye: Right conjunctiva is not injected.     Left eye: Left conjunctiva is not injected.  Pulmonary:      Effort: Pulmonary effort is normal.  Abdominal:     Tenderness: There is no abdominal tenderness. There is no rebound.  Genitourinary:    General: Normal vulva.     Exam position: Lithotomy position.     Pubic Area: No rash or pubic lice.      Labia:        Right: No rash or lesion.        Left: No rash or lesion.      Vagina: Normal. No vaginal discharge, erythema or lesions.     Cervix: No cervical motion tenderness, discharge, lesion or erythema.     Uterus: Not enlarged and not tender.      Rectum: Normal.     Comments: pH = 4 Lymphadenopathy:     Cervical: No cervical adenopathy.     Upper Body:     Right upper body: No supraclavicular or axillary adenopathy.  Left upper body: No supraclavicular or axillary adenopathy.  Skin:    General: Skin is warm and dry.     Findings: No lesion or rash.  Neurological:     Mental Status: She is alert and oriented to person, place, and time.    Assessment and Plan:  CANDACE BEGUE is a 43 y.o. female presenting to the [facility] Department for STI screening.  Patient accepted the following screenings: vaginal CT/GC swab and vaginal wet prep  Screening for venereal disease -     Chlamydia/Gonorrhea  Lab -     WET PREP FOR TRICH, YEAST, CLUE  Yeast vaginitis -     Fluconazole ; Take 1 tablet (150 mg total) by mouth every 3 (three) days.   Counseling: Discussed time line for State Lab results and that patient will be called with positive results and encouraged patient to call if they had not heard in 2 weeks.  Counseled to return or seek care for continued or worsening symptoms Recommended repeat testing in 3 months with positive results. Recommended condom use with all sex for STI prevention.   No follow-ups on file.  No future appointments.  Betsey CHRISTELLA Helling, MD
